# Patient Record
Sex: Female | Born: 1970 | Race: White | Hispanic: No | Marital: Married | Smoking: Never smoker
Health system: Southern US, Community
[De-identification: ages and names within clinical notes are randomized; demographics above are authoritative.]

## PROBLEM LIST (undated history)

## (undated) DIAGNOSIS — R519 Headache, unspecified: Secondary | ICD-10-CM

## (undated) DIAGNOSIS — M5136 Other intervertebral disc degeneration, lumbar region: Secondary | ICD-10-CM

## (undated) DIAGNOSIS — E119 Type 2 diabetes mellitus without complications: Secondary | ICD-10-CM

## (undated) DIAGNOSIS — M1990 Unspecified osteoarthritis, unspecified site: Secondary | ICD-10-CM

## (undated) DIAGNOSIS — M51369 Other intervertebral disc degeneration, lumbar region without mention of lumbar back pain or lower extremity pain: Secondary | ICD-10-CM

## (undated) DIAGNOSIS — E049 Nontoxic goiter, unspecified: Secondary | ICD-10-CM

## (undated) DIAGNOSIS — R51 Headache: Secondary | ICD-10-CM

## (undated) DIAGNOSIS — E039 Hypothyroidism, unspecified: Secondary | ICD-10-CM

## (undated) DIAGNOSIS — M5126 Other intervertebral disc displacement, lumbar region: Secondary | ICD-10-CM

## (undated) HISTORY — PX: OTHER SURGICAL HISTORY: SHX169

## (undated) HISTORY — DX: Hypothyroidism, unspecified: E03.9

## (undated) HISTORY — PX: APPENDECTOMY: SHX54

## (undated) HISTORY — DX: Other intervertebral disc degeneration, lumbar region without mention of lumbar back pain or lower extremity pain: M51.369

## (undated) HISTORY — PX: WISDOM TOOTH EXTRACTION: SHX21

## (undated) HISTORY — DX: Headache: R51

## (undated) HISTORY — PX: BACK SURGERY: SHX140

## (undated) HISTORY — DX: Nontoxic goiter, unspecified: E04.9

## (undated) HISTORY — DX: Unspecified osteoarthritis, unspecified site: M19.90

## (undated) HISTORY — DX: Other intervertebral disc displacement, lumbar region: M51.26

## (undated) HISTORY — DX: Headache, unspecified: R51.9

## (undated) HISTORY — DX: Type 2 diabetes mellitus without complications: E11.9

## (undated) HISTORY — DX: Other intervertebral disc degeneration, lumbar region: M51.36

---

## 2016-08-11 ENCOUNTER — Ambulatory Visit (INDEPENDENT_AMBULATORY_CARE_PROVIDER_SITE_OTHER): Payer: Commercial Managed Care - PPO | Admitting: Neurology

## 2016-08-11 ENCOUNTER — Encounter (INDEPENDENT_AMBULATORY_CARE_PROVIDER_SITE_OTHER): Payer: Self-pay

## 2016-08-11 ENCOUNTER — Encounter: Payer: Self-pay | Admitting: Neurology

## 2016-08-11 VITALS — BP 126/75 | HR 82 | Ht 68.0 in | Wt 219.0 lb

## 2016-08-11 DIAGNOSIS — G43711 Chronic migraine without aura, intractable, with status migrainosus: Secondary | ICD-10-CM | POA: Insufficient documentation

## 2016-08-11 DIAGNOSIS — R519 Headache, unspecified: Secondary | ICD-10-CM

## 2016-08-11 DIAGNOSIS — R51 Headache: Secondary | ICD-10-CM

## 2016-08-11 DIAGNOSIS — G43011 Migraine without aura, intractable, with status migrainosus: Secondary | ICD-10-CM

## 2016-08-11 DIAGNOSIS — R0683 Snoring: Secondary | ICD-10-CM | POA: Diagnosis not present

## 2016-08-11 DIAGNOSIS — M542 Cervicalgia: Secondary | ICD-10-CM

## 2016-08-11 MED ORDER — AMITRIPTYLINE HCL 10 MG PO TABS: 10.0000 mg | ORAL_TABLET | Freq: Every day | ORAL | 11 refills | Status: DC

## 2016-08-11 MED ORDER — METHYLPREDNISOLONE 4 MG PO TBPK: ORAL_TABLET | ORAL | 1 refills | Status: DC

## 2016-08-11 NOTE — Patient Instructions (Addendum)
Remember to drink plenty of fluid, eat healthy meals and do not skip any meals. Try to eat protein with a every meal and eat a healthy snack such as fruit or nuts in between meals. Try to keep a regular sleep-wake schedule and try to exercise daily, particularly in the form of walking, 20-30 minutes a day, if you can.   As far as your medications are concerned, I would like to suggest:  Amitriptyline 10mg  at bedtime  I would like to see you back 8 weeks, sooner if we need to. Please call us with any interim questions, concerns, problems, updates or refill requests.   Please also call us for any test results so we can go over those with you on the phone.  My clinical assistant and will answer any of your questions and relay your messages to me and also relay most of my messages to you.   Our phone number is [phone]. We also have an after hours call service for urgent matters and there is a physician on-call for urgent questions. For any emergencies you know to call 911 or go to the nearest emergency room  Prednisone tablets What is this medicine? PREDNISONE (PRED ni sone) is a corticosteroid. It is commonly used to treat inflammation of the skin, joints, lungs, and other organs. Common conditions treated include asthma, allergies, and arthritis. It is also used for other conditions, such as blood disorders and diseases of the adrenal glands. This medicine may be used for other purposes; ask your health care provider or pharmacist if you have questions. COMMON BRAND NAME(S): Deltasone, Predone, Sterapred, Sterapred DS What should I tell my health care provider before I take this medicine? They need to know if you have any of these conditions: -Cushing's syndrome -diabetes -glaucoma -heart disease -high blood pressure -infection (especially a virus infection such as chickenpox, cold sores, or herpes) -kidney disease -liver disease -mental illness -myasthenia  gravis -osteoporosis -seizures -stomach or intestine problems -thyroid disease -an unusual or allergic reaction to lactose, prednisone, other medicines, foods, dyes, or preservatives -pregnant or trying to get pregnant -breast-feeding How should I use this medicine? Take this medicine by mouth with a glass of water. Follow the directions on the prescription label. Take this medicine with food. If you are taking this medicine once a day, take it in the morning. Do not take more medicine than you are told to take. Do not suddenly stop taking your medicine because you may develop a severe reaction. Your doctor will tell you how much medicine to take. If your doctor wants you to stop the medicine, the dose may be slowly lowered over time to avoid any side effects. Talk to your pediatrician regarding the use of this medicine in children. Special care may be needed. Overdosage: If you think you have taken too much of this medicine contact a poison control center or emergency room at once. NOTE: This medicine is only for you. Do not share this medicine with others. What if I miss a dose? If you miss a dose, take it as soon as you can. If it is almost time for your next dose, talk to your doctor or health care professional. You may need to miss a dose or take an extra dose. Do not take double or extra doses without advice. What may interact with this medicine? Do not take this medicine with any of the following medications: -metyrapone -mifepristone This medicine may also interact with the following medications: -aminoglutethimide -amphotericin B -  aspirin and aspirin-like medicines -barbiturates -certain medicines for diabetes, like glipizide or glyburide -cholestyramine -cholinesterase inhibitors -cyclosporine -digoxin -diuretics -ephedrine -female hormones, like estrogens and birth control pills -isoniazid -ketoconazole -NSAIDS, medicines for pain and inflammation, like ibuprofen or  naproxen -phenytoin -rifampin -toxoids -vaccines -warfarin This list may not describe all possible interactions. Give your health care provider a list of all the medicines, herbs, non-prescription drugs, or dietary supplements you use. Also tell them if you smoke, drink alcohol, or use illegal drugs. Some items may interact with your medicine. What should I watch for while using this medicine? Visit your doctor or health care professional for regular checks on your progress. If you are taking this medicine over a prolonged period, carry an identification card with your name and address, the type and dose of your medicine, and your doctor's name and address. This medicine may increase your risk of getting an infection. Tell your doctor or health care professional if you are around anyone with measles or chickenpox, or if you develop sores or blisters that do not heal properly. If you are going to have surgery, tell your doctor or health care professional that you have taken this medicine within the last twelve months. Ask your doctor or health care professional about your diet. You may need to lower the amount of salt you eat. This medicine may affect blood sugar levels. If you have diabetes, check with your doctor or health care professional before you change your diet or the dose of your diabetic medicine. What side effects may I notice from receiving this medicine? Side effects that you should report to your doctor or health care professional as soon as possible: -allergic reactions like skin rash, itching or hives, swelling of the face, lips, or tongue -changes in emotions or moods -changes in vision -depressed mood -eye pain -fever or chills, cough, sore throat, pain or difficulty passing urine -increased thirst -swelling of ankles, feet Side effects that usually do not require medical attention (report to your doctor or health care professional if they continue or are  bothersome): -confusion, excitement, restlessness -headache -nausea, vomiting -skin problems, acne, thin and shiny skin -trouble sleeping -weight gain This list may not describe all possible side effects. Call your doctor for medical advice about side effects. You may report side effects to FDA at 1-800-FDA-1088. Where should I keep my medicine? Keep out of the reach of children. Store at room temperature between 15 and 30 degrees C (59 and 86 degrees F). Protect from light. Keep container tightly closed. Throw away any unused medicine after the expiration date. NOTE: This sheet is a summary. It may not cover all possible information. If you have questions about this medicine, talk to your doctor, pharmacist, or health care provider.  2018 Elsevier/Gold Standard (2010-09-18 10:57:14)   Amitriptyline tablets What is this medicine? AMITRIPTYLINE (a mee TRIP ti leen) is used to treat depression. This medicine may be used for other purposes; ask your health care provider or pharmacist if you have questions. COMMON BRAND NAME(S): Elavil, Vanatrip What should I tell my health care provider before I take this medicine? They need to know if you have any of these conditions: -an alcohol problem -asthma, difficulty breathing -bipolar disorder or schizophrenia -difficulty passing urine, prostate trouble -glaucoma -heart disease or previous heart attack -liver disease -over active thyroid -seizures -thoughts or plans of suicide, a previous suicide attempt, or family history of suicide attempt -an unusual or allergic reaction to amitriptyline, other medicines, foods, dyes,  or preservatives -pregnant or trying to get pregnant -breast-feeding How should I use this medicine? Take this medicine by mouth with a drink of water. Follow the directions on the prescription label. You can take the tablets with or without food. Take your medicine at regular intervals. Do not take it more often than  directed. Do not stop taking this medicine suddenly except upon the advice of your doctor. Stopping this medicine too quickly may cause serious side effects or your condition may worsen. A special MedGuide will be given to you by the pharmacist with each prescription and refill. Be sure to read this information carefully each time. Talk to your pediatrician regarding the use of this medicine in children. Special care may be needed. Overdosage: If you think you have taken too much of this medicine contact a poison control center or emergency room at once. NOTE: This medicine is only for you. Do not share this medicine with others. What if I miss a dose? If you miss a dose, take it as soon as you can. If it is almost time for your next dose, take only that dose. Do not take double or extra doses. What may interact with this medicine? Do not take this medicine with any of the following medications: -arsenic trioxide -certain medicines used to regulate abnormal heartbeat or to treat other heart conditions -cisapride -droperidol -halofantrine -linezolid -MAOIs like Carbex, Eldepryl, Marplan, Nardil, and Parnate -methylene blue -other medicines for mental depression -phenothiazines like perphenazine, thioridazine and chlorpromazine -pimozide -probucol -procarbazine -sparfloxacin -St. John's Wort -ziprasidone This medicine may also interact with the following medications: -atropine and related drugs like hyoscyamine, scopolamine, tolterodine and others -barbiturate medicines for inducing sleep or treating seizures, like phenobarbital -cimetidine -disulfiram -ethchlorvynol -thyroid hormones such as levothyroxine This list may not describe all possible interactions. Give your health care provider a list of all the medicines, herbs, non-prescription drugs, or dietary supplements you use. Also tell them if you smoke, drink alcohol, or use illegal drugs. Some items may interact with your  medicine. What should I watch for while using this medicine? Tell your doctor if your symptoms do not get better or if they get worse. Visit your doctor or health care professional for regular checks on your progress. Because it may take several weeks to see the full effects of this medicine, it is important to continue your treatment as prescribed by your doctor. Patients and their families should watch out for new or worsening thoughts of suicide or depression. Also watch out for sudden changes in feelings such as feeling anxious, agitated, panicky, irritable, hostile, aggressive, impulsive, severely restless, overly excited and hyperactive, or not being able to sleep. If this happens, especially at the beginning of treatment or after a change in dose, call your health care professional. Bonita Quin may get drowsy or dizzy. Do not drive, use machinery, or do anything that needs mental alertness until you know how this medicine affects you. Do not stand or sit up quickly, especially if you are an older patient. This reduces the risk of dizzy or fainting spells. Alcohol may interfere with the effect of this medicine. Avoid alcoholic drinks. Do not treat yourself for coughs, colds, or allergies without asking your doctor or health care professional for advice. Some ingredients can increase possible side effects. Your mouth may get dry. Chewing sugarless gum or sucking hard candy, and drinking plenty of water will help. Contact your doctor if the problem does not go away or is severe. This medicine  may cause dry eyes and blurred vision. If you wear contact lenses you may feel some discomfort. Lubricating drops may help. See your eye doctor if the problem does not go away or is severe. This medicine can cause constipation. Try to have a bowel movement at least every 2 to 3 days. If you do not have a bowel movement for 3 days, call your doctor or health care professional. This medicine can make you more sensitive to  the sun. Keep out of the sun. If you cannot avoid being in the sun, wear protective clothing and use sunscreen. Do not use sun lamps or tanning beds/booths. What side effects may I notice from receiving this medicine? Side effects that you should report to your doctor or health care professional as soon as possible: -allergic reactions like skin rash, itching or hives, swelling of the face, lips, or tongue -anxious -breathing problems -changes in vision -confusion -elevated mood, decreased need for sleep, racing thoughts, impulsive behavior -eye pain -fast, irregular heartbeat -feeling faint or lightheaded, falls -feeling agitated, angry, or irritable -fever with increased sweating -hallucination, loss of contact with reality -seizures -stiff muscles -suicidal thoughts or other mood changes -tingling, pain, or numbness in the feet or hands -trouble passing urine or change in the amount of urine -trouble sleeping -unusually weak or tired -vomiting -yellowing of the eyes or skin Side effects that usually do not require medical attention (report to your doctor or health care professional if they continue or are bothersome): -change in sex drive or performance -change in appetite or weight -constipation -dizziness -dry mouth -nausea -tired -tremors -upset stomach This list may not describe all possible side effects. Call your doctor for medical advice about side effects. You may report side effects to FDA at 1-800-FDA-1088. Where should I keep my medicine? Keep out of the reach of children. Store at room temperature between 20 and 25 degrees C (68 and 77 degrees F). Throw away any unused medicine after the expiration date. NOTE: This sheet is a summary. It may not cover all possible information. If you have questions about this medicine, talk to your doctor, pharmacist, or health care provider.  2018 Elsevier/Gold Standard (2015-07-05 12:14:15)    Migraine Headache A migraine  headache is an intense, throbbing pain on one side or both sides of the head. Migraines may also cause other symptoms, such as nausea, vomiting, and sensitivity to light and noise. What are the causes? Doing or taking certain things may also trigger migraines, such as:  Alcohol.  Smoking.  Medicines, such as: ? Medicine used to treat chest pain (nitroglycerine). ? Birth control pills. ? Estrogen pills. ? Certain blood pressure medicines.  Aged cheeses, chocolate, or caffeine.  Foods or drinks that contain nitrates, glutamate, aspartame, or tyramine.  Physical activity.  Other things that may trigger a migraine include:  Menstruation.  Pregnancy.  Hunger.  Stress, lack of sleep, too much sleep, or fatigue.  Weather changes.  What increases the risk? The following factors may make you more likely to experience migraine headaches:  Age. Risk increases with age.  Family history of migraine headaches.  Being Caucasian.  Depression and anxiety.  Obesity.  Being a woman.  Having a hole in the heart (patent foramen ovale) or other heart problems.  What are the signs or symptoms? The main symptom of this condition is pulsating or throbbing pain. Pain may:  Happen in any area of the head, such as on one side or both sides.  Interfere  with daily activities.  Get worse with physical activity.  Get worse with exposure to bright lights or loud noises.  Other symptoms may include:  Nausea.  Vomiting.  Dizziness.  General sensitivity to bright lights, loud noises, or smells.  Before you get a migraine, you may get warning signs that a migraine is developing (aura). An aura may include:  Seeing flashing lights or having blind spots.  Seeing bright spots, halos, or zigzag lines.  Having tunnel vision or blurred vision.  Having numbness or a tingling feeling.  Having trouble talking.  Having muscle weakness.  How is this diagnosed? A migraine headache  can be diagnosed based on:  Your symptoms.  A physical exam.  Tests, such as CT scan or MRI of the head. These imaging tests can help rule out other causes of headaches.  Taking fluid from the spine (lumbar puncture) and analyzing it (cerebrospinal fluid analysis, or CSF analysis).  How is this treated? A migraine headache is usually treated with medicines that:  Relieve pain.  Relieve nausea.  Prevent migraines from coming back.  Treatment may also include:  Acupuncture.  Lifestyle changes like avoiding foods that trigger migraines.  Follow these instructions at home: Medicines  Take over-the-counter and prescription medicines only as told by your health care provider.  Do not drive or use heavy machinery while taking prescription pain medicine.  To prevent or treat constipation while you are taking prescription pain medicine, your health care provider may recommend that you: ? Drink enough fluid to keep your urine clear or pale yellow. ? Take over-the-counter or prescription medicines. ? Eat foods that are high in fiber, such as fresh fruits and vegetables, whole grains, and beans. ? Limit foods that are high in fat and processed sugars, such as fried and sweet foods. Lifestyle  Avoid alcohol use.  Do not use any products that contain nicotine or tobacco, such as cigarettes and e-cigarettes. If you need help quitting, ask your health care provider.  Get at least 8 hours of sleep every night.  Limit your stress. General instructions   Keep a journal to find out what may trigger your migraine headaches. For example, write down: ? What you eat and drink. ? How much sleep you get. ? Any change to your diet or medicines.  If you have a migraine: ? Avoid things that make your symptoms worse, such as bright lights. ? It may help to lie down in a dark, quiet room. ? Do not drive or use heavy machinery. ? Ask your health care provider what activities are safe for  you while you are experiencing symptoms.  Keep all follow-up visits as told by your health care provider. This is important. Contact a health care provider if:  You develop symptoms that are different or more severe than your usual migraine symptoms. Get help right away if:  Your migraine becomes severe.  You have a fever.  You have a stiff neck.  You have vision loss.  Your muscles feel weak or like you cannot control them.  You start to lose your balance often.  You develop trouble walking.  You faint. This information is not intended to replace advice given to you by your health care provider. Make sure you discuss any questions you have with your health care provider. Document Released: 02/02/2005 Document Revised: 08/23/2015 Document Reviewed: 07/22/2015 Else[employer] Interactive Patient Education  2017 [employer]

## 2016-08-11 NOTE — Progress Notes (Signed)
[facility]    Provider:  Dr Lucia Gaskins Referring Provider: Burnis Medin, * Primary Care Physician:  Burnis Medin, PA-C  CC:  Headaches  HPI:  Ann Bailey is a 46 y.o. female here as a referral from Dr. Piedad Climes for headache. Past medical history of newly diagnosed diabetes, headache, hyperlipidemia, vitamin D deficiency, headache, rheumatoid arthritis. Previous frequency of headaches 1-2x a month. Last 3 months it has gotten worse. Left temple area, ice pick pain, sharp on the left temple with flashes of light and sometimes a shadow over the right eye in association with the headache. She endorses light sensitivity and needing to cut off the lights which helped. She had the worst headache over memorial day. She went to [facility] on June 6[facility]. She had compazine and Haldol had side effects to the Haldol. She had a ganglion block which helped that day. She was given a prednisone pack which helped. Always centered on the right temple, pressure, head was blowing up. She would have nausea. Some dizziness. Moving head makes it worse. Sitting still helps. She has daily headaches since March never goes away. Right now is dull but then can be severe, worsens throughout the day. She stays tired, she wakes up with headaches, she snores heavily and her family has woken her up and told her she snores. She does not take OTC medication. She is on Gabapentin and hydrocodone. She hears swishing in the right ear. No significant vision changes. She regularly goes to the doctor. Continuous headaches.   Medications tried: Topiramate, gabapentin, prednisone, tramadol, Norco  Reviewed notes, labs and imaging from outside physicians, which showed:  Hemoglobin A1c 8.6 LDL 196 CMP drawn 07/30/2016 shows BUN 9, creatinine 0.7, glucose 227 and AST 39 otherwise normal  Per review of records, patient was seen in the emergency room on June 6 of this year reported new onset headache about 3  months previously located initially in the right temporal area now has radiates spread throughout her entire head. Associated with intermittent neck pain, dizziness, loss of balance. Pain is severe. Tried Topamax, prednisone, tramadol, Norco. No vision changes. No fever, nausea, vomiting, weakness, numbness, vertigo. Neurologic exam was negative CT of the head was negative. She was treated with by mouth and IM headache medications as well as sphenopalatine ganglion block with bupivacaine with improvement of head pain.  MRI of the brain reviewed images (ptient brought disk to review images) normal MRI of the brain 07/16/2016.  Review of Systems: Patient complains of symptoms per HPI as well as the following symptoms: headache, eye pain, bluor twice a month headaches he is see if headache he has not 2 times a month rred vision or   I. Pertinent negatives and positives per HPI. All others negative.   Social History   Social History  . Marital status: Married    Spouse name: N/A  . Number of children: N/A  . Years of education: N/A   Occupational History  . Not on file.   Social History Main Topics  . Smoking status: Never Smoker  . Smokeless tobacco: Never Used  . Alcohol use 0.6 oz/week    1 Shots of liquor per week     Comment: occasionally  . Drug use: No  . Sexual activity: Not on file   Other Topics Concern  . Not on file   Social History Narrative  . No narrative on file    Family History  Problem Relation Age of Onset  . Migraines  Neg Hx     Past Medical History:  Diagnosis Date  . Arthritis   . 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 Diabetes mellitus without complication (HCC)   . Goiter   . Headache   . Hypothyroidism     Past Surgical History:  Procedure Laterality Date  . APPENDECTOMY    . CESAREAN SECTION    . left knee arthoroscpy twice     . WISDOM TOOTH EXTRACTION      Current Outpatient Prescriptions  Medication Sig Dispense  Refill  . ABATACEPT IV Inject 10,000 mg into the vein every 30 (thirty) days.    Marland Kitchen. atorvastatin (LIPITOR) 10 MG tablet Take 10 mg by mouth.    . Cholecalciferol (VITAMIN D3) 2000 units TABS Take by mouth.    . citalopram (CELEXA) 40 MG tablet Take by mouth.    . gabapentin (NEURONTIN) 300 MG capsule TAKE 2 TO 3 CAPSULES NIGHTLY    . HYDROcodone-acetaminophen (NORCO/VICODIN) 5-325 MG tablet Take 1 tablet by mouth every 6 (six) hours as needed.     Marland Kitchen. ibuprofen (ADVIL,MOTRIN) 800 MG tablet prn    . levonorgestrel (MIRENA) 20 MCG/24HR IUD by Intrauterine route.    Marland Kitchen. levothyroxine (SYNTHROID, LEVOTHROID) 200 MCG tablet Take by mouth.    . meclizine (ANTIVERT) 12.5 MG tablet Take by mouth 2 (two) times daily as needed.     . metFORMIN (GLUCOPHAGE-XR) 500 MG 24 hr tablet Take 1,000 mg by mouth.    . sulfaSALAzine (AZULFIDINE) 500 MG EC tablet TAKE THREE TABLETS (1,500 MG TOTAL) BY MOUTH 2 (TWO) TIMES DAILY.    Marland Kitchen. amitriptyline (ELAVIL) 10 MG tablet Take 1 tablet (10 mg total) by mouth at bedtime. 30 tablet 11  . methylPREDNISolone (MEDROL DOSEPAK) 4 MG TBPK tablet follow package directions 21 tablet 1   No current facility-administered medications for this visit.     Allergies as of 08/11/2016  . (No Known Allergies)    Vitals: BP 126/75 (BP Location: Right Arm, Patient Position: Sitting, Cuff Size: Normal)   Pulse 82   Ht 5\' 8"  (1.727 m)   Wt 219 lb (99.3 kg)   BMI 33.30 kg/m  Last Weight:  Wt Readings from Last 1 Encounters:  08/11/16 219 lb (99.3 kg)   Last Height:   Ht Readings from Last 1 Encounters:  08/11/16 5\' 8"  (1.727 m)   Physical exam: Exam: Gen: NAD, conversant, well nourised, obese, well groomed                     CV: RRR, no MRG. No Carotid Bruits. No peripheral edema, warm, nontender Eyes: Conjunctivae clear without exudates or hemorrhage  Neuro: Detailed Neurologic Exam  Speech:    Speech is normal; fluent and spontaneous with normal comprehension.    Cognition:    The patient is oriented to person, place, and time;     recent and remote memory intact;     language fluent;     normal attention, concentration,     fund of knowledge Cranial Nerves:    The pupils are equal, round, and reactive to light. The fundi are normal and spontaneous venous pulsations are present. Visual fields are full to finger confrontation. Extraocular movements are intact. Trigeminal sensation is intact and the muscles of mastication are normal. The face is symmetric. The palate elevates in the midline. Hearing intact. Voice is normal. Shoulder shrug is normal. The tongue has normal motion without fasciculations.   Coordination:  Normal finger to nose and heel to shin. Normal rapid alternating movements.   Gait:    Heel-toe and tandem gait are normal.   Motor Observation:    No asymmetry, no atrophy, and no involuntary movements noted. Tone:    Normal muscle tone.    Posture:    Posture is normal. normal erect    Strength:    Strength is V/V in the upper and lower limbs.      Sensation: intact to LT     Reflex Exam:  DTR's:    Deep tendon reflexes in the upper and lower extremities are normal bilaterally.   Toes:    The toes are downgoing bilaterally.   Clonus:    Clonus is absent.     Assessment/Plan:  Patient with worsened migraines over the last 4 months. She has tried multiple medications including topiramate. MRI brain negative.Neurologic exam normal.  - Topiramate was used only at low dose before she stopped it, I still think we can come back and use a therapeutic dose; don;t want to discount Topiramate until on a therapeutic dose - At this tim ewith try a small dose of Amitriptyline at night which may also help with patient's insomnia. Then can retry topiramate at a higher dose - Sleep evaluation:  She stays tired, she wakes up with headaches, she snores heavily and her family has woken her up and told her she snores.  - Suggest not  overusing opioids or OTC meds (she takes for RA only occassonally) but it appears there is no medication overuse.  - Nerve blocks today  - Prednisone taper but patient must be very careful with her diabetes and monitor her glucose daily - MRI without signs of IIH but can consider checking opening pressure in the future, no visual symptoms per patient - Discussed good eating habits and food choices, exercise and diet. Follow closely with pcp to manage vascular risk factors such as DM, HTN, HLD   Discussed: To prevent or relieve headaches, try the following: Cool Compress. Lie down and place a cool compress on your head.  Avoid headache triggers. If certain foods or odors seem to have triggered your migraines in the past, avoid them. A headache diary might help you identify triggers.  Include physical activity in your daily routine. Try a daily walk or other moderate aerobic exercise.  Manage stress. Find healthy ways to cope with the stressors, such as delegating tasks on your to-do list.  Practice relaxation techniques. Try deep breathing, yoga, massage and visualization.  Eat regularly. Eating regularly scheduled meals and maintaining a healthy diet might help prevent headaches. Also, drink plenty of fluids.  Follow a regular sleep schedule. Sleep deprivation might contribute to headaches Consider biofeedback. With this mind-body technique, you learn to control certain bodily functions - such as muscle tension, heart rate and blood pressure - to prevent headaches or reduce headache pain.    Proceed to emergency room if you experience new or worsening symptoms or symptoms do not resolve, if you have new neurologic symptoms or if headache is severe, or for any concerning symptom.   Provided education and documentation from American headache Society toolbox including articles on: chronic migraine medication overuse headache, chronic migraines, prevention of migraines, behavioral and other  nonpharmacologic treatments for headache.  Orders Placed This Encounter  Procedures  . Ambulatory referral to Sleep Studies    Cc:  [place], [place], [place]   Naomie Dean, MD  [facility] [address]  [place], [place] [address]  Phone [phone] Fax [phone]

## 2016-08-11 NOTE — Progress Notes (Signed)
Performed by Dr. Lucia GaskinsAhern M.D.  All procedures a documented below were medically necessary, reasonable and appropriate based on the patient's history, medical diagnosis and physician opinion. Verbal informed consent was obtained from the patient, patient was informed of potential risk of procedure, including bruising, bleeding, hematoma formation, infection, muscle weakness, muscle pain, numbness, transient hypertension, transient hyperglycemia and transient insomnia among others. All areas injected were topically clean with isopropyl rubbing alcohol. Nonsterile nonlatex gloves were worn during the procedure.  [id number]: occipitalis bilaterally, frontalis, temporalis bilaterally   SPHENOCATH PROCEDURE NOTE   Procedure: The patient was placed in the supine position. A temperature strip was added to the cheek area after the area was cleaned with alcohol. The Sphenocath was lubricated with gel, and placed in the right naris. The catheter was inserted above the middle turbinate to the posterior nasal cavity, and then withdrawn 1 cm. The catheter was deployed and rotated approximately 20 towards the nose. 2-1/2 mL of 2% lidocaine was deployed. The patient was asked to swallow during the injection. The patient demonstrated erythema of the sclera of the eye on this side, and an increase in the cheek temperature was noted from 92 F to 98 F.  This process was repeated on the left side, with similar results. The increase in cheek temperature was documented from 8792 F to 100 F.  The patient tolerated the procedure well. No complications of the procedure were noted. The patient was kept in the supine position for 8 minutes following the procedure. She was given small sips of water after sitting up following the procedure.  Lidocaine 2% NDC 82956-213-0863323-466-27 Lot: [id number]  Exp: 12/2017  NDC: 62952-841-3263323-486-27   Naomie DeanAhern, Antonia B  Temperature on cheeks before injecting lidocaine:  Right: 92 Left: 92  Temperature on  cheeks after injecting lidocaine:  Right : 98 Left: 100  Lidocaine 2% Lot: [id number] Exp: 12/2017 NDC: 25366-440-3463323-486-27

## 2016-08-11 NOTE — Progress Notes (Signed)
Nerve block:  Xylocaine 2% (20 mg/ml) NDC 16109-604-5403323-486-27 Lot [id number] Exp 11/20  Bupivacaine 0.25%  NDC [id number] Lot [id number] Exp: 03/22  Six 3 ml syringes w/ 1.5 ml marcaine and 1.5 ml lidocaine

## 2016-08-12 ENCOUNTER — Telehealth: Payer: Self-pay | Admitting: Neurology

## 2016-08-12 NOTE — Telephone Encounter (Signed)
Ann Bailey called to inform Dr Lucia GaskinsAhern that re: her head aches it is only slightly there in the right temple, does not feel she needs to return to office right away. Ann Bailey not requesting a call back.

## 2016-09-22 ENCOUNTER — Ambulatory Visit (INDEPENDENT_AMBULATORY_CARE_PROVIDER_SITE_OTHER): Payer: Commercial Managed Care - PPO | Admitting: Neurology

## 2016-09-22 ENCOUNTER — Encounter: Payer: Self-pay | Admitting: Neurology

## 2016-09-22 VITALS — BP 121/75 | HR 82 | Ht 68.5 in | Wt 217.0 lb

## 2016-09-22 DIAGNOSIS — R0683 Snoring: Secondary | ICD-10-CM

## 2016-09-22 DIAGNOSIS — M05772 Rheumatoid arthritis with rheumatoid factor of left ankle and foot without organ or systems involvement: Secondary | ICD-10-CM

## 2016-09-22 DIAGNOSIS — R51 Headache: Secondary | ICD-10-CM

## 2016-09-22 DIAGNOSIS — E669 Obesity, unspecified: Secondary | ICD-10-CM

## 2016-09-22 DIAGNOSIS — G4719 Other hypersomnia: Secondary | ICD-10-CM | POA: Diagnosis not present

## 2016-09-22 DIAGNOSIS — M05771 Rheumatoid arthritis with rheumatoid factor of right ankle and foot without organ or systems involvement: Secondary | ICD-10-CM

## 2016-09-22 DIAGNOSIS — R519 Headache, unspecified: Secondary | ICD-10-CM

## 2016-09-22 NOTE — Progress Notes (Signed)
Subjective:    Patient ID: Ann Bailey is a 46 y.o. female.  HPI     Huston Foley, MD, PhD [facility] [address], [address] [address] [place], [place] [address]  Dear Desma Maxim,   I saw your patient, Ann Bailey, upon your kind request in my clinic today for initial consultation of her sleep disorder, in particular, concern for underlying obstructive sleep apnea. The patient is unaccompanied today. As you know, Ann Bailey is a 46 year old right-handed woman with an underlying medical history of diabetes, recurrent headaches, hyperlipidemia, vitamin D deficiency, rheumatoid arthritis, hypothyroidism, and obesity, who reports snoring and excessive daytime somnolence, as well as morning headaches. I reviewed your office note from 08/11/2016. Her Epworth sleepiness score is 7 out of 24, fatigue score is 37 out of 63. She reports that she snores loudly at times per family's feedback. She lives with her husband and her 2 children. She works with home health. She is a nonsmoker, does not drink alcohol and drinks caffeine in limitation, usually 1 soda per day. She is familiar with sleep apnea diagnosis and treatment as her husband has OSA and CPAP machine. She is not sure that she would be able to tolerate a CPAP machine. She has occasional morning headaches, denies night to night nocturia or restless leg symptoms or leg twitching at night. She does not always wake up rested and does feel tired during the day, attributed daytime tiredness to having rheumatoid arthritis. She has a family history of RA in her father. She was diagnosed in 2000. She sees a rheumatologist. She reports stress at work. She works generally speaking from 8-5. Bedtime varies, between 9 PM and midnight, wakeup time around 6:30 AM. She was started on amitriptyline recently, feels like she has been sleeping a little better after starting it.   Her Past Medical History Is Significant For: Past Medical  History:  Diagnosis Date  . Arthritis   . 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 Diabetes mellitus without complication (HCC)   . Goiter   . Headache   . Hypothyroidism     Her Past Surgical History Is Significant For: Past Surgical History:  Procedure Laterality Date  . APPENDECTOMY    . CESAREAN SECTION    . left knee arthoroscpy twice     . WISDOM TOOTH EXTRACTION      Her Family History Is Significant For: Family History  Problem Relation Age of Onset  . Migraines Neg Hx     Her Social History Is Significant For: Social History   Social History  . Marital status: Married    Spouse name: N/A  . Number of children: N/A  . Years of education: N/A   Social History Main Topics  . Smoking status: Never Smoker  . Smokeless tobacco: Never Used  . Alcohol use 0.6 oz/week    1 Shots of liquor per week     Comment: occasionally  . Drug use: No  . Sexual activity: Not Asked   Other Topics Concern  . None   Social History Narrative  . None    Her Allergies Are:  No Known Allergies:   Her Current Medications Are:  Outpatient Encounter Prescriptions as of 09/22/2016  Medication Sig  . ABATACEPT IV Inject 1,000 mg into the vein every 30 (thirty) days.   Marland Kitchen amitriptyline (ELAVIL) 10 MG tablet Take 1 tablet (10 mg total) by mouth at bedtime.  Marland Kitchen atorvastatin (LIPITOR) 10 MG tablet  Take 10 mg by mouth.  . Cholecalciferol (VITAMIN D3) 2000 units TABS Take by mouth.  . citalopram (CELEXA) 40 MG tablet Take by mouth.  . gabapentin (NEURONTIN) 300 MG capsule TAKE 2 TO 3 CAPSULES NIGHTLY  . HYDROcodone-acetaminophen (NORCO/VICODIN) 5-325 MG tablet Take 1 tablet by mouth every 6 (six) hours as needed.   Marland Kitchen ibuprofen (ADVIL,MOTRIN) 800 MG tablet prn  . levonorgestrel (MIRENA) 20 MCG/24HR IUD by Intrauterine route.  Marland Kitchen levothyroxine (SYNTHROID, LEVOTHROID) 200 MCG tablet Take by mouth.  . meclizine (ANTIVERT) 12.5 MG tablet Take by mouth 2 (two) times daily as  needed.   . metFORMIN (GLUCOPHAGE-XR) 500 MG 24 hr tablet Take 1,000 mg by mouth.  . sulfaSALAzine (AZULFIDINE) 500 MG EC tablet TAKE THREE TABLETS (1,500 MG TOTAL) BY MOUTH 2 (TWO) TIMES DAILY.  . [DISCONTINUED] methylPREDNISolone (MEDROL DOSEPAK) 4 MG TBPK tablet follow package directions   No facility-administered encounter medications on file as of 09/22/2016.   :  Review of Systems:  Out of a complete 14 point review of systems, all are reviewed and negative with the exception of these symptoms as listed below: Review of Systems  Neurological:       Pt presents today to discuss her sleep. Pt has never had a sleep study but does endorse snoring. Pt's husband has osa and uses a cpap nightly.  Epworth Sleepiness Scale 0= would never doze 1= slight chance of dozing 2= moderate chance of dozing 3= high chance of dozing  Sitting and reading: 1 Watching TV: 1 Sitting inactive in a public place (ex. Theater or meeting): 0 As a passenger in a car for an hour without a break: 2 Lying down to rest in the afternoon: 2 Sitting and talking to someone: 0 Sitting quietly after lunch (no alcohol): 1 In a car, while stopped in traffic: 0 Total: 7     Objective:  Neurological Exam  Physical Exam Physical Examination:   Vitals:   09/22/16 1051  BP: 121/75  Pulse: 82    General Examination: The patient is a very pleasant 46 y.o. female in no acute distress. She appears well-developed and well-nourished and well groomed.   HEENT: Normocephalic, atraumatic, pupils are equal, round and reactive to light and accommodation. Funduscopic exam is normal with sharp disc margins noted. Extraocular tracking is good without limitation to gaze excursion or nystagmus noted. Normal smooth pursuit is noted. Hearing is grossly intact. Tympanic membranes are clear bilaterally. Face is symmetric with normal facial animation and normal facial sensation. Speech is clear with no dysarthria noted. There is no  hypophonia. There is no lip, neck/head, jaw or voice tremor. Neck is supple with full range of passive and active motion. There are no carotid bruits on auscultation. Oropharynx exam reveals: mild mouth dryness, good dental hygiene and moderate airway crowding, due to smaller airway entry, larger appearing uvula and tonsils in place, about 1-2+ in size bilaterally. She has a mild to moderate overbite. Neck circumference is 16-1/4 inches. Mallampati is class II. Tongue protrudes centrally and palate elevates symmetrically.   Chest: Clear to auscultation without wheezing, rhonchi or crackles noted.  Heart: S1+S2+0, regular and normal without murmurs, rubs or gallops noted.   Abdomen: Soft, non-tender and non-distended with normal bowel sounds appreciated on auscultation.  Extremities: There is no pitting edema in the distal lower extremities bilaterally. Pedal pulses are intact.  Skin: Warm and dry without trophic changes noted.  Musculoskeletal: exam reveals no obvious joint deformities, tenderness or joint swelling or  erythema.   Neurologically:  Mental status: The patient is awake, alert and oriented in all 4 spheres. Her immediate and remote memory, attention, language skills and fund of knowledge are appropriate. There is no evidence of aphasia, agnosia, apraxia or anomia. Speech is clear with normal prosody and enunciation. Thought process is linear. Mood is normal and affect is normal.  Cranial nerves II - XII are as described above under HEENT exam. In addition: shoulder shrug is normal with equal shoulder height noted. Motor exam: Normal bulk, strength and tone is noted. There is no drift, tremor or rebound. Romberg is negative. Reflexes are 1+ throughout. Fine motor skills and coordination: intact with normal finger taps, normal hand movements, normal rapid alternating patting, normal foot taps and normal foot agility.  Cerebellar testing: No dysmetria or intention tremor on finger to nose  testing. Heel to shin is unremarkable bilaterally. There is no truncal or gait ataxia.  Sensory exam: intact to light touch in the upper and lower extremities.  Gait, station and balance: She stands easily. No veering to one side is noted. No leaning to one side is noted. Posture is age-appropriate and stance is narrow based. Gait shows normal stride length and normal pace. No problems turning are noted. Tandem walk is unremarkable.   Assessment and Plan:  In summary, NANCYJO GIVHAN is a very pleasant 46 y.o.-year old female with an underlying medical history of diabetes, recurrent headaches, hyperlipidemia, vitamin D deficiency, rheumatoid arthritis, hypothyroidism, and obesity, whose history and physical exam are concerning for obstructive sleep apnea (OSA). I had a long chat with the patient about my findings and the diagnosis of OSA, its prognosis and treatment options. We talked about medical treatments, surgical interventions and non-pharmacological approaches. I explained in particular the risks and ramifications of untreated moderate to severe OSA, especially with respect to developing cardiovascular disease down the Road, including congestive heart failure, difficult to treat hypertension, cardiac arrhythmias, or stroke. Even type 2 diabetes has, in part, been linked to untreated OSA. Symptoms of untreated OSA include daytime sleepiness, memory problems, mood irritability and mood disorder such as depression and anxiety, lack of energy, as well as recurrent headaches, especially morning headaches. We talked about trying to maintain a healthy lifestyle in general, as well as the importance of weight control. I encouraged the patient to eat healthy, exercise daily and keep well hydrated, to keep a scheduled bedtime and wake time routine, to not skip any meals and eat healthy snacks in between meals. I advised the patient not to drive when feeling sleepy. I recommended the following at this time:  sleep study with potential positive airway pressure titration. (We will score hypopneas at 4%).   I explained the sleep test procedure to the patient and also outlined possible surgical and non-surgical treatment options of OSA, including the use of a custom-made dental device (which would require a referral to a specialist dentist or oral surgeon), upper airway surgical options, such as pillar implants, radiofrequency surgery, tongue base surgery, and UPPP (which would involve a referral to an ENT surgeon). Rarely, jaw surgery such as mandibular advancement may be considered.  I also explained the CPAP treatment option to the patient, who indicated that she would be willing to try CPAP if the need arises. I explained the importance of being compliant with PAP treatment, not only for insurance purposes but primarily to improve Her symptoms, and for the patient's long term health benefit, including to reduce Her cardiovascular risks. I answered all  her questions today and the patient was in agreement. I would like to see her back after the sleep study is completed and encouraged her to call with any interim questions, concerns, problems or updates.   Thank you very much for allowing me to participate in the care of this nice patient. If I can be of any further assistance to you please do not hesitate to talk to me.  Sincerely,   Huston FoleySaima Jakhai Fant, MD, PhD

## 2016-09-22 NOTE — Patient Instructions (Signed)

## 2016-10-12 ENCOUNTER — Encounter: Payer: Self-pay | Admitting: Neurology

## 2016-10-12 ENCOUNTER — Ambulatory Visit (INDEPENDENT_AMBULATORY_CARE_PROVIDER_SITE_OTHER): Payer: Commercial Managed Care - PPO | Admitting: Neurology

## 2016-10-12 VITALS — BP 140/90 | HR 99 | Ht 68.5 in | Wt 219.2 lb

## 2016-10-12 DIAGNOSIS — G43711 Chronic migraine without aura, intractable, with status migrainosus: Secondary | ICD-10-CM | POA: Diagnosis not present

## 2016-10-12 DIAGNOSIS — M542 Cervicalgia: Secondary | ICD-10-CM | POA: Diagnosis not present

## 2016-10-12 MED ORDER — PROPRANOLOL HCL 20 MG PO TABS: 20.0000 mg | ORAL_TABLET | Freq: Two times a day (BID) | ORAL | 6 refills | Status: DC

## 2016-10-12 MED ORDER — AMITRIPTYLINE HCL 10 MG PO TABS: 20.0000 mg | ORAL_TABLET | Freq: Every day | ORAL | 11 refills | Status: DC

## 2016-10-12 NOTE — Progress Notes (Signed)
[facility]    Provider:  Dr Lucia Gaskins Referring Provider: Burnis Medin, * Primary Care Physician:  Piedad Climes, [place], [place]  CC:  Headaches   Interval history 10/12/2016: Topiramate caused side effects, she is on the Amitriptyline. She feels improved bu still having 15 headache days a month and 10 migraine days. She has seen Dr. Frances Furbish and sleep eval, pending insurance approval, discussed other migraine preventative medications and we can try and decided to start propranolol. There is no aura, no medication overuse.  HPI:  Ann Bailey is a 46 y.o. female here as a referral from Dr. Piedad Climes for headache. Past medical history of newly diagnosed diabetes, headache, hyperlipidemia, vitamin D deficiency, headache, rheumatoid arthritis. Previous frequency of headaches 1-2x a month. Last 5 months it has gotten worse. Left temple area, ice pick pain, sharp on the left temple, pounding, throbbing  and sometimes a shadow over the right eye in association with the headache and nausea. Can last up to 24 hours.  She endorses light sensitivity and needing to cut off the lights which helped. She had the worst headache over memorial day. She went [facility]. She had compazine and Haldol had side effects to the Haldol. She had a ganglion block which helped that day. She was given a prednisone pack which helped. Always centered on the right temple, pressure, head was blowing up. She would have nausea. Some dizziness. Moving head makes it worse. Sitting still helps. She has daily headaches since March never goes away. Right now is dull but then can be severe, worsens throughout the day. She stays tired, she wakes up with headaches, she snores heavily and her family has woken her up and told her she snores. She does not take OTC medication. She is on Gabapentin and hydrocodone. She hears swishing in the right ear. No significant vision changes. She regularly goes to the  doctor. Continuous headaches.   Medications tried: Topiramate, gabapentin, prednisone, tramadol, Norco  Reviewed notes, labs and imaging from outside physicians, which showed:  Hemoglobin A1c 8.6 LDL 196 CMP drawn 07/30/2016 shows BUN 9, creatinine 0.7, glucose 227 and AST 39 otherwise normal  Per review of records, patient was seen in the emergency room on June 6 of this year reported new onset headache about 3 months previously located initially in the right temporal area now has radiates spread throughout her entire head. Associated with intermittent neck pain, dizziness, loss of balance. Pain is severe. Tried Topamax, prednisone, tramadol, Norco. No vision changes. No fever, nausea, vomiting, weakness, numbness, vertigo. Neurologic exam was negative CT of the head was negative. She was treated with by mouth and IM headache medications as well as sphenopalatine ganglion block with bupivacaine with improvement of head pain.  MRI of the brain reviewed images (ptient brought disk to review images) normal MRI of the brain 07/16/2016.  Review of Systems: Patient complains of symptoms per HPI as well as the following symptoms: headache, eye pain, bluor twice a month headaches he is see if headache he has not 2 times a month rred vision or   I. Pertinent negatives and positives per HPI. All others negative.   Social History   Social History  . Marital status: Married    Spouse name: N/A  . Number of children: N/A  . Years of education: N/A   Occupational History  . Not on file.   Social History Main Topics  . Smoking status: Never Smoker  . Smokeless tobacco: Never  Used  . Alcohol use 0.6 oz/week    1 Shots of liquor per week     Comment: occasionally  . Drug use: No  . Sexual activity: Not on file   Other Topics Concern  . Not on file   Social History Narrative  . No narrative on file    Family History  Problem Relation Age of Onset  . Migraines Neg Hx      Past Medical History:  Diagnosis Date  . Arthritis   . 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 Diabetes mellitus without complication (HCC)   . Goiter   . Headache   . Hypothyroidism     Past Surgical History:  Procedure Laterality Date  . APPENDECTOMY    . CESAREAN SECTION    . left knee arthoroscpy twice     . WISDOM TOOTH EXTRACTION      Current Outpatient Prescriptions  Medication Sig Dispense Refill  . ABATACEPT IV Inject 1,000 mg into the vein every 30 (thirty) days.     Marland Kitchen amitriptyline (ELAVIL) 10 MG tablet Take 1 tablet (10 mg total) by mouth at bedtime. 30 tablet 11  . atorvastatin (LIPITOR) 10 MG tablet Take 10 mg by mouth.    . Cholecalciferol (VITAMIN D3) 2000 units TABS Take by mouth.    . citalopram (CELEXA) 40 MG tablet Take by mouth.    . gabapentin (NEURONTIN) 300 MG capsule TAKE 2 TO 3 CAPSULES NIGHTLY    . HYDROcodone-acetaminophen (NORCO/VICODIN) 5-325 MG tablet Take 1 tablet by mouth every 6 (six) hours as needed.     Marland Kitchen ibuprofen (ADVIL,MOTRIN) 800 MG tablet prn    . levonorgestrel (MIRENA) 20 MCG/24HR IUD by Intrauterine route.    Marland Kitchen levothyroxine (SYNTHROID, LEVOTHROID) 200 MCG tablet Take by mouth.    . meclizine (ANTIVERT) 12.5 MG tablet Take by mouth 2 (two) times daily as needed.     . metFORMIN (GLUCOPHAGE-XR) 500 MG 24 hr tablet Take 1,000 mg by mouth.    . sulfaSALAzine (AZULFIDINE) 500 MG EC tablet TAKE THREE TABLETS (1,500 MG TOTAL) BY MOUTH 2 (TWO) TIMES DAILY.     No current facility-administered medications for this visit.     Allergies as of 10/12/2016  . (No Known Allergies)    Vitals: BP 140/90 (BP Location: Right Arm, Patient Position: Sitting, Cuff Size: Normal)   Pulse 99   Ht 5' 8.5" (1.74 m)   Wt 219 lb 3.2 oz (99.4 kg)   SpO2 96%   BMI 32.84 kg/m  Last Weight:  Wt Readings from Last 1 Encounters:  10/12/16 219 lb 3.2 oz (99.4 kg)   Last Height:   Ht Readings from Last 1 Encounters:  10/12/16 5' 8.5"  (1.74 m)   Physical exam: Exam: Gen: NAD, conversant, well nourised, obese, well groomed                     CV: RRR, no MRG. No Carotid Bruits. No peripheral edema, warm, nontender Eyes: Conjunctivae clear without exudates or hemorrhage  Neuro: Detailed Neurologic Exam  Speech:    Speech is normal; fluent and spontaneous with normal comprehension.  Cognition:    The patient is oriented to person, place, and time;     recent and remote memory intact;     language fluent;     normal attention, concentration,     fund of knowledge Cranial Nerves:    The pupils are equal, round, and reactive to  light. The fundi are normal and spontaneous venous pulsations are present. Visual fields are full to finger confrontation. Extraocular movements are intact. Trigeminal sensation is intact and the muscles of mastication are normal. The face is symmetric. The palate elevates in the midline. Hearing intact. Voice is normal. Shoulder shrug is normal. The tongue has normal motion without fasciculations.   Coordination:    Normal finger to nose and heel to shin. Normal rapid alternating movements.   Gait:    Heel-toe and tandem gait are normal.   Motor Observation:    No asymmetry, no atrophy, and no involuntary movements noted. Tone:    Normal muscle tone.    Posture:    Posture is normal. normal erect    Strength:    Strength is V/V in the upper and lower limbs.      Sensation: intact to LT     Reflex Exam:  DTR's:    Deep tendon reflexes in the upper and lower extremities are normal bilaterally.   Toes:    The toes are downgoing bilaterally.   Clonus:    Clonus is absent.   Assessment/Plan:  Patient with worsened migraines over the last 6 months. She has tried multiple medications including topiramate. MRI brain negative.Neurologic exam normal. Chronic migraines without aura.  - Physical therapy for dry needling - Increase Amitriptyline at night which may also help with  patient's insomnia.  - Sleep evaluation:  She stays tired, she wakes up with headaches, she snores heavily and her family has woken her up and told her she snores. Approval pending. - Suggest not overusing opioids or OTC meds (she takes for RA only occassonally) but it appears there is no medication overuse.  - Discussed good eating habits and food choices, exercise and diet. Follow closely with pcp to manage vascular risk factors such as DM, HTN, HLD - She is a botox candidate  PT and dry needling Botox for migraines: discussed, will go forward  Ann Dean, MD  [facility] [address] [place], [place] [address]  Phone [phone] Fax [phone]  A total of 25 minutes was spent face-to-face with this patient. Over half this time was spent on counseling patient on the chronic migraine diagnosis and different diagnostic and therapeutic options available.

## 2016-10-12 NOTE — Patient Instructions (Signed)
Propranolol tablets What is this medicine? PROPRANOLOL (proe PRAN oh lole) is a beta-blocker. Beta-blockers reduce the workload on the heart and help it to beat more regularly. This medicine is used to treat high blood pressure, to control irregular heart rhythms (arrhythmias) and to relieve chest pain caused by angina. It may also be helpful after a heart attack. This medicine is also used to prevent migraine headaches, relieve uncontrollable shaking (tremors), and help certain problems related to the thyroid gland and adrenal gland. This medicine may be used for other purposes; ask your health care provider or pharmacist if you have questions. COMMON BRAND NAME(S): Inderal What should I tell my health care provider before I take this medicine? They need to know if you have any of these conditions: -circulation problems or blood vessel disease -diabetes -history of heart attack or heart disease, vasospastic angina -kidney disease -liver disease -lung or breathing disease, like asthma or emphysema -pheochromocytoma -slow heart rate -thyroid disease -an unusual or allergic reaction to propranolol, other beta-blockers, medicines, foods, dyes, or preservatives -pregnant or trying to get pregnant -breast-feeding How should I use this medicine? Take this medicine by mouth with a glass of water. Follow the directions on the prescription label. Take your doses at regular intervals. Do not take your medicine more often than directed. Do not stop taking except on your the advice of your doctor or health care professional. Talk to your pediatrician regarding the use of this medicine in children. Special care may be needed. Overdosage: If you think you have taken too much of this medicine contact a poison control center or emergency room at once. NOTE: This medicine is only for you. Do not share this medicine with others. What if I miss a dose? If you miss a dose, take it as soon as you can. If it is  almost time for your next dose, take only that dose. Do not take double or extra doses. What may interact with this medicine? Do not take this medicine with any of the following medications: -feverfew -phenothiazines like chlorpromazine, mesoridazine, prochlorperazine, thioridazine This medicine may also interact with the following medications: -aluminum hydroxide gel -antipyrine -antiviral medicines for HIV or AIDS -barbiturates like phenobarbital -certain medicines for blood pressure, heart disease, irregular heart beat -cimetidine -ciprofloxacin -diazepam -fluconazole -haloperidol -isoniazid -medicines for cholesterol like cholestyramine or colestipol -medicines for mental depression -medicines for migraine headache like almotriptan, eletriptan, frovatriptan, naratriptan, rizatriptan, sumatriptan, zolmitriptan -NSAIDs, medicines for pain and inflammation, like ibuprofen or naproxen -phenytoin -rifampin -teniposide -theophylline -thyroid medicines -tolbutamide -warfarin -zileuton This list may not describe all possible interactions. Give your health care provider a list of all the medicines, herbs, non-prescription drugs, or dietary supplements you use. Also tell them if you smoke, drink alcohol, or use illegal drugs. Some items may interact with your medicine. What should I watch for while using this medicine? Visit your doctor or health care professional for regular check ups. Check your blood pressure and pulse rate regularly. Ask your health care professional what your blood pressure and pulse rate should be, and when you should contact them. You may get drowsy or dizzy. Do not drive, use machinery, or do anything that needs mental alertness until you know how this drug affects you. Do not stand or sit up quickly, especially if you are an older patient. This reduces the risk of dizzy or fainting spells. Alcohol can make you more drowsy and dizzy. Avoid alcoholic drinks. This  medicine can affect blood sugar levels. If   you have diabetes, check with your doctor or health care professional before you change your diet or the dose of your diabetic medicine. Do not treat yourself for coughs, colds, or pain while you are taking this medicine without asking your doctor or health care professional for advice. Some ingredients may increase your blood pressure. What side effects may I notice from receiving this medicine? Side effects that you should report to your doctor or health care professional as soon as possible: -allergic reactions like skin rash, itching or hives, swelling of the face, lips, or tongue -breathing problems -changes in blood sugar -cold hands or feet -difficulty sleeping, nightmares -dry peeling skin -hallucinations -muscle cramps or weakness -slow heart rate -swelling of the legs and ankles -vomiting Side effects that usually do not require medical attention (report to your doctor or health care professional if they continue or are bothersome): -change in sex drive or performance -diarrhea -dry sore eyes -hair loss -nausea -weak or tired This list may not describe all possible side effects. Call your doctor for medical advice about side effects. You may report side effects to FDA at 1-800-FDA-1088. Where should I keep my medicine? Keep out of the reach of children. Store at room temperature between 15 and 30 degrees C (59 and 86 degrees F). Protect from light. Throw away any unused medicine after the expiration date. NOTE: This sheet is a summary. It may not cover all possible information. If you have questions about this medicine, talk to your doctor, pharmacist, or health care provider.  2018 Elsevier/Gold Standard (2012-10-07 14:51:53)  

## 2016-10-25 ENCOUNTER — Ambulatory Visit (INDEPENDENT_AMBULATORY_CARE_PROVIDER_SITE_OTHER): Payer: Commercial Managed Care - PPO | Admitting: Neurology

## 2016-10-25 DIAGNOSIS — G4733 Obstructive sleep apnea (adult) (pediatric): Secondary | ICD-10-CM | POA: Diagnosis not present

## 2016-10-25 DIAGNOSIS — R51 Headache: Secondary | ICD-10-CM

## 2016-10-25 DIAGNOSIS — R519 Headache, unspecified: Secondary | ICD-10-CM

## 2016-10-25 DIAGNOSIS — M0579 Rheumatoid arthritis with rheumatoid factor of multiple sites without organ or systems involvement: Secondary | ICD-10-CM

## 2016-10-25 DIAGNOSIS — E669 Obesity, unspecified: Secondary | ICD-10-CM

## 2016-10-25 DIAGNOSIS — G4720 Circadian rhythm sleep disorder, unspecified type: Secondary | ICD-10-CM

## 2016-10-26 NOTE — Procedures (Signed)
PATIENT'S NAME:  Ann Bailey, Ann Bailey DOB:      1970/03/18      MR#:    [id number]     DATE OF RECORDING: 10/25/2016 REFERRING M.D.: Naomie Dean, MD   PCP: Burnett Kanaris, PA-C Study Performed:   Baseline Polysomnogram HISTORY: 46 year old woman with a history of diabetes, recurrent headaches, hyperlipidemia, vitamin D deficiency, rheumatoid arthritis, hypothyroidism, and obesity, who reports snoring and excessive daytime somnolence, as well as morning headaches. The patient endorsed the Epworth Sleepiness Scale at 7/24 points. The patient's weight 217 pounds with a height of 68 (inches), resulting in a BMI of 32.4 kg/m2. The patient's neck circumference measured 16.2 inches.  CURRENT MEDICATIONS: Abatacept, Amitriptyline, Atorvastatin, Cholecalciferol, Citalopram, Gabapentin, Hydrocodone, Ibuprofen, Levothyroxine, Meclizine, Metformin and Sulfasalazine   PROCEDURE:  This is a multichannel digital polysomnogram utilizing the Somnostar 11.2 system.  Electrodes and sensors were applied and monitored per AASM Specifications.   EEG, EOG, Chin and Limb EMG, were sampled at 200 Hz.  ECG, Snore and Nasal Pressure, Thermal Airflow, Respiratory Effort, CPAP Flow and Pressure, Oximetry was sampled at 50 Hz. Digital video and audio were recorded.      BASELINE STUDY  Lights Out was at 21:16 and Lights On at 05:01.  Total recording time (TRT) was 465.5 minutes, with a total sleep time (TST) of  350.5 minutes.   The patient's sleep latency was 76 minutes, which is delayed. REM sleep was absent. The sleep efficiency was 75.3 %, which is reduced.    SLEEP ARCHITECTURE: WASO (Wake after sleep onset) was 49.5 minutes with mild to moderate sleep fragmentation noted.  There were 29 minutes in Stage N1, 321.5 minutes Stage N2, 0 minutes Stage N3 and 0 minutes in Stage REM.  The percentage of Stage N1 was 8.3%, Stage N2 was 91.7%, which is markedly increased, both Stage N3 and Stage R (REM sleep) were absent. The  arousals were noted as: 71 were spontaneous, 0 were associated with PLMs, 48 were associated with respiratory events.    Audio and video analysis did not show any abnormal or unusual movements, behaviors, phonations or vocalizations.  The patient took no bathroom breaks. Mild snoring was noted.  EKG was in keeping with normal sinus rhythm (NSR).  RESPIRATORY ANALYSIS:  There were a total of 48 respiratory events:  0 obstructive apneas, 0 central apneas and 0 mixed apneas with a total of 0 apneas and an apnea index (AI) of 0 /hour. There were 48 hypopneas with a hypopnea index of 8.2 /hour. The patient also had 0 respiratory event related arousals (RERAs).      The total APNEA/HYPOPNEA INDEX (AHI) was 8.2/hour and the total RESPIRATORY DISTURBANCE INDEX was 8.2 /hour.  0 events occurred in REM sleep and 96 events in NREM. The REM AHI was 0 /hour, versus a non-REM AHI of 8.2. The patient spent 127.5 minutes of total sleep time in the supine position and 223 minutes in non-supine.. The supine AHI was 0.9 versus a non-supine AHI of 12.4.  OXYGEN SATURATION & C02:  The Wake baseline 02 saturation was 92%, with the lowest being 89%. Time spent below 89% saturation equaled 0 minutes.  PERIODIC LIMB MOVEMENTS: The patient had a total of 0 Periodic Limb Movements.  The Periodic Limb Movement (PLM) index was 0 and the PLM Arousal index was 0/hour.  Post-study, the patient indicated that sleep was the same as usual.   IMPRESSION:  1. Obstructive Sleep Apnea (OSA) 2. Dysfunctions associated with sleep stages or arousal  from sleep  RECOMMENDATIONS:  1. This study demonstrates overall mild obstructive sleep apnea. Please note that the AHI and O2 nadir during the study and may represent an underestimation of the patient's sleep-disordered breathing since no REM sleep was achieved during this study. Most patients with obstructive sleep apnea have the most frequent and significant apneic or hypopneic events and  more significant desaturations during REM sleep, particularly while in supine REM sleep. Given the patient's medical history and sleep related complaints, a full-night CPAP titration study is recommended to optimize therapy. Other treatment options may include avoidance of supine sleep position along with weight loss, upper airway or jaw surgery in selected patients or the use of an oral appliance in certain patients. ENT evaluation and/or consultation with a maxillofacial surgeon or dentist may be feasible in some instances.    2. Please note that untreated obstructive sleep apnea carries additional perioperative morbidity. Patients with significant obstructive sleep apnea should receive perioperative PAP therapy and the surgeons and particularly the anesthesiologist should be informed of the diagnosis and the severity of the sleep disordered breathing. 3. This study shows sleep fragmentation and abnormal sleep stage percentages; these are nonspecific findings and per se do not signify an intrinsic sleep disorder or a cause for the patient's sleep-related symptoms. Causes include (but are not limited to) the first night effect of the sleep study, circadian rhythm disturbances, medication effect or an underlying mood disorder or medical problem.  4. The patient should be cautioned not to drive, work at heights, or operate dangerous or heavy equipment when tired or sleepy. Review and reiteration of good sleep hygiene measures should be pursued with any patient. 5. The patient will be [facility] discussion of the test results and further management strategies. The referring provider will be notified of the test results.  I certify that I have reviewed the entire raw data recording prior to the issuance of this report in accordance with the Standards of Accreditation of the American Academy of Sleep Medicine (AASM)   Huston FoleySaima Fredrica Capano, MD, PhD Diplomat, American Board of Psychiatry  and Neurology (Neurology and Sleep Medicine)

## 2016-10-26 NOTE — Addendum Note (Signed)
Addended by: Huston FoleyATHAR, Dinna Severs on: 10/26/2016 08:20 AM   Modules accepted: Orders

## 2016-10-26 NOTE — Progress Notes (Signed)
Patient referred by Dr. Lucia GaskinsAhern, seen by me on 09/22/16, diagnostic PSG on 10/25/16.    Please call and notify the patient that the recent sleep study did confirm the diagnosis of mild obstructive sleep apnea. Please note that the AHI and O2 nadir during the study and may represent an underestimation of the patient's sleep-disordered breathing since no REM sleep was achieved during this study. Most patients with obstructive sleep apnea have the most frequent and significant apneic or hypopneic events and more significant desaturations during REM sleep, particularly while in supine REM sleep. Given the patient's medical history and sleep related complaints, I recommend treatment in the form of CPAP. This will require a repeat sleep study for proper titration and mask fitting. Please explain to patient and arrange for a CPAP titration study. I have placed an order in the chart. Thanks, and please route to [facility] for scheduling next sleep study.  Huston FoleySaima Lyrick Worland, MD, PhD [facility])

## 2016-10-27 ENCOUNTER — Telehealth: Payer: Self-pay

## 2016-10-27 DIAGNOSIS — G4733 Obstructive sleep apnea (adult) (pediatric): Secondary | ICD-10-CM

## 2016-10-27 NOTE — Telephone Encounter (Signed)
-----   Message from Huston FoleySaima Athar, MD sent at 10/26/2016  8:20 AM EDT ----- Patient referred by Dr. Lucia GaskinsAhern, seen by me on 09/22/16, diagnostic PSG on 10/25/16.    Please call and notify the patient that the recent sleep study did confirm the diagnosis of mild obstructive sleep apnea. Please note that the AHI and O2 nadir during the study and may represent an underestimation of the patient's sleep-disordered breathing since no REM sleep was achieved during this study. Most patients with obstructive sleep apnea have the most frequent and significant apneic or hypopneic events and more significant desaturations during REM sleep, particularly while in supine REM sleep. Given the patient's medical history and sleep related complaints, I recommend treatment in the form of CPAP. This will require a repeat sleep study for proper titration and mask fitting. Please explain to patient and arrange for a CPAP titration study. I have placed an order in the chart. Thanks, and [facility] next sleep study.  Huston FoleySaima Athar, MD, PhD [facility])

## 2016-10-27 NOTE — Telephone Encounter (Signed)
I called pt. I advised her that Dr. Frances FurbishAthar recommends seeing her dentist for a dental device tx for osa. If her own dentist does not treat osa with dental device, Dr. Frances FurbishAthar has placed a referral to Dr. Toni ArthursFuller. Dr. Frances FurbishAthar will see pt prn. Pt says that she will check with her dentist tomorrow and if needed, make an appt with Dr. Toni ArthursFuller.

## 2016-10-27 NOTE — Telephone Encounter (Signed)
Please refer to Irene LimboSandra Fuller, DDS, fax [phone], ph [phone] for oral appliance.  Referral placed in chart, please notify patient.  She may see her own dentist of course, if he/she provides treatment for OSA with a dental device. She may want to check with her own dentist first.  I can see her back as needed.

## 2016-10-27 NOTE — Telephone Encounter (Signed)
I called pt. I advised her that her PSG did confirm mild osa but in the absence of REM sleep, this may underestimate the true AHI/O2. I explained that that generally more desaturations and apneic events are noted in REM sleep. Dr. Frances FurbishAthar recommends a cpap titration study. Pt is not keen to use a cpap, her husband uses one, and she wants to know if Dr. Frances FurbishAthar recommends anything else for her, such as oral appliance or ENT referral. I did explain several times that the absence of REM sleep underestimates her AHI and that Dr. Frances FurbishAthar recommends cpap, but pt insists that she is not keen on the cpap, but will do it if Dr. Frances FurbishAthar insists that this is the only option. Pt stills to know if she can try alternatives to treat her osa. Pt verbalized understanding of results.

## 2016-10-28 ENCOUNTER — Encounter: Payer: Self-pay | Admitting: Neurology

## 2016-10-28 ENCOUNTER — Ambulatory Visit (INDEPENDENT_AMBULATORY_CARE_PROVIDER_SITE_OTHER): Payer: Commercial Managed Care - PPO | Admitting: Neurology

## 2016-10-28 VITALS — BP 144/92 | HR 87 | Ht 68.5 in

## 2016-10-28 DIAGNOSIS — G43711 Chronic migraine without aura, intractable, with status migrainosus: Secondary | ICD-10-CM

## 2016-10-28 NOTE — Progress Notes (Signed)
Botox-100unitsx2 vials Lot: [id number] Expiration: 05/2019 NDC: 4540-9811-910023-1145-01 [id number]  0.9% Sodium Chloride - 4mL total Lot: [id number] Expiration: 03/19/2018 NDC: 4696-2952-840409-4888-03  Dx: X32.440G43.711 B/B

## 2016-10-28 NOTE — Telephone Encounter (Signed)
Referral has been sent to Dr. Toni ArthursFuller.

## 2016-10-28 NOTE — Progress Notes (Signed)

## 2016-11-09 ENCOUNTER — Telehealth: Payer: Self-pay | Admitting: Neurology

## 2016-11-09 NOTE — Telephone Encounter (Signed)
I called to schedule the patient, she did not answer and had no option for VM.

## 2016-11-16 NOTE — Telephone Encounter (Signed)
Patient has untreated sleep apnea and this can be causing her headaches. Unfortunately nothing we do will help the headaches until she addresses the sleep panea. Botox doesn;t make headaches worse. Please discuss about her sleep apnea and see if she can get an infusion in the infusion center today for migraine if it is very severe.  If not severe Iwe can try a triptan, don;t think she has tried triptans (please ask) like imitrex or relpax thanks

## 2016-11-16 NOTE — Telephone Encounter (Signed)
Patient called and requested to speak with someone regarding a head ache she has been experiencing since getting her injections.  Please call back at [phone].

## 2016-11-16 NOTE — Telephone Encounter (Signed)
Pt states that ever since she had the botox she has had severe headaches for the first week. After the first week her headache slowed down. Pt states that ibuprofen didn't help. Pt states that she is taken the amitriptyline and the propranolol and has not noticed any help with her headaches. Pt is still dealing with stress at work and she is sure that this is probably where it stems from. She wanted to know if the headaches are coming from the botox she received and is there anything else to try to help alleviate the headaches. I informed the patient I would get this information to Dr Lucia Gaskins to get her recommendations and contact her back.

## 2016-11-17 NOTE — Telephone Encounter (Signed)
Called and spoke with the patient and made her aware that we feel this headaches are caused by the untreated sleep apnea. Pt has an apt at the end of the week to get fitted for the dental device so she is in the process of seeking some help with that. Pt is agreeable to trying a medication that could help when the headaches are severe. I confirmed the pharmacy on file and told her we would order and get it sent there.

## 2016-11-18 ENCOUNTER — Other Ambulatory Visit: Payer: Self-pay | Admitting: Neurology

## 2016-11-18 MED ORDER — RIZATRIPTAN BENZOATE 10 MG PO TABS: 10.0000 mg | ORAL_TABLET | ORAL | 0 refills | Status: DC | PRN

## 2016-11-18 NOTE — Telephone Encounter (Signed)
I ordered her some maxalt thanks

## 2017-01-27 ENCOUNTER — Ambulatory Visit: Payer: Commercial Managed Care - PPO | Admitting: Neurology

## 2017-01-28 ENCOUNTER — Ambulatory Visit: Payer: Commercial Managed Care - PPO | Admitting: Neurology

## 2017-02-18 ENCOUNTER — Ambulatory Visit: Payer: Commercial Managed Care - PPO | Admitting: Adult Health

## 2017-02-18 ENCOUNTER — Encounter (INDEPENDENT_AMBULATORY_CARE_PROVIDER_SITE_OTHER): Payer: Self-pay

## 2017-02-18 ENCOUNTER — Encounter: Payer: Self-pay | Admitting: Neurology

## 2017-02-18 VITALS — BP 102/78 | HR 72 | Ht 68.5 in | Wt 220.0 lb

## 2017-02-18 DIAGNOSIS — G43711 Chronic migraine without aura, intractable, with status migrainosus: Secondary | ICD-10-CM | POA: Diagnosis not present

## 2017-02-18 NOTE — Progress Notes (Signed)
     BOTOX PROCEDURE NOTE FOR MIGRAINE HEADACHE    Contraindications and precautions discussed with patient(above). Aseptic procedure was observed and patient tolerated procedure. Procedure performed by Butch PennyMegan Kareli Hossain NP  The condition has existed for more than 6 months, and pt does not have a diagnosis of ALS, Myasthenia Gravis or Lambert-Eaton Syndrome. Risks and benefits of injections discussed and pt agrees to proceed with the procedure. Written consent obtained  These injections are medically necessary. He receives good benefits from these injections. Having 2 headaches/week. These injections do not cause sedations or hallucinations which the oral therapies may cause.  Indication/Diagnosis: chronic migraine BOTOX(J0585) injection was performed according to protocol by [employer]. 200 units of BOTOX was dissolved into 4 cc NS.  NDC: [address]-4000023-1145-01  Type of toxin: Botox  Lot: [id number] Expiration: 07/2019 NDC: 7829-5621-300023-1145-01 [id number]   Description of procedure:  The patient was placed in a sitting position. The standard protocol was used for Botox as follows, with 5 units of Botox injected at each site:   -Procerus muscle, midline injection  -Corrugator muscle, bilateral injection  -Frontalis muscle, bilateral injection, with 2 sites each side, medial injection was performed in the upper one third of the frontalis muscle, in the region vertical from the medial inferior edge of the superior orbital rim. The lateral injection was again in the upper one third of the forehead vertically above the lateral limbus of the cornea, 1.5 cm lateral to the medial injection site.  -Temporalis muscle injection, 4 sites, bilaterally. The first injection was 3 cm above the tragus of the ear, second injection site was 1.5 cm to 3 cm up from the first injection site in line with the tragus of the ear. The third injection site was 1.5-3 cm forward between the first 2 injection sites. The fourth  injection site was 1.5 cm posterior to the second injection site.  -Occipitalis muscle injection, 3 sites, bilaterally. The first injection was done one half way between the occipital protuberance and the tip of the mastoid process behind the ear. The second injection site was done lateral and superior to the first, 1 fingerbreadth from the first injection. The third injection site was 1 fingerbreadth superiorly and medially from the first injection site.  -Cervical paraspinal muscle injection, 2 sites, bilateral knee first injection site was 1 cm from the midline of the cervical spine, 3 cm inferior to the lower border of the occipital protuberance. The second injection site was 1.5 cm superiorly and laterally to the first injection site.  -Trapezius muscle injection was performed at 3 sites, bilaterally. The first injection site was in the upper trapezius muscle halfway between the inflection point of the neck, and the acromion. The second injection site was one half way between the acromion and the first injection site. The third injection was done between the first injection site and the inflection point of the neck.   Will return for repeat injection in 3 months.   A 200 unit sof Botox was used, 155 units were injected, the rest of the Botox was wasted. The patient tolerated the procedure well, there were no complications of the above procedure.

## 2017-02-18 NOTE — Progress Notes (Signed)
Botox-100unitsx2 vials Lot: [id number] Expiration: 07/2019 NDC: [id number] [id number]  0.9% Sodium Chloride- 4mL total Lot: [id number] Expiration: 08/2018 NDC: [id number]  Dx: L24.401G43.711 B/B

## 2017-05-20 ENCOUNTER — Ambulatory Visit: Payer: Commercial Managed Care - PPO | Admitting: Neurology

## 2017-06-25 ENCOUNTER — Other Ambulatory Visit: Payer: Self-pay | Admitting: Neurology

## 2017-06-29 ENCOUNTER — Telehealth: Payer: Self-pay | Admitting: Adult Health

## 2017-06-29 NOTE — Telephone Encounter (Signed)
Pt needing to r/s botox appt

## 2017-06-29 NOTE — Telephone Encounter (Signed)
I called and scheduled the patient.  °

## 2017-07-13 ENCOUNTER — Ambulatory Visit: Payer: Commercial Managed Care - PPO | Admitting: Neurology

## 2017-07-13 ENCOUNTER — Encounter: Payer: Self-pay | Admitting: Neurology

## 2017-07-13 DIAGNOSIS — G43711 Chronic migraine without aura, intractable, with status migrainosus: Secondary | ICD-10-CM

## 2017-07-13 MED ORDER — RIZATRIPTAN BENZOATE 10 MG PO TABS: 10.0000 mg | ORAL_TABLET | ORAL | 11 refills | Status: DC | PRN

## 2017-07-13 MED ORDER — ONDANSETRON HCL 8 MG PO TABS: 8.0000 mg | ORAL_TABLET | Freq: Three times a day (TID) | ORAL | 6 refills | Status: AC | PRN

## 2017-07-13 MED ORDER — PROPRANOLOL HCL 20 MG PO TABS: 20.0000 mg | ORAL_TABLET | Freq: Two times a day (BID) | ORAL | 11 refills | Status: DC

## 2017-07-13 MED ORDER — AMITRIPTYLINE HCL 10 MG PO TABS: 20.0000 mg | ORAL_TABLET | Freq: Every day | ORAL | 11 refills | Status: DC

## 2017-07-13 NOTE — Progress Notes (Signed)
Botox- 100 units x 2 vials Lot: [id number] Expiration: 11/2019 NDC: 1610-9604-54  Bacteriostatic 0.9% Sodium Chloride- 4mL total Lot: [id number] Expiration: 06/17/2018 NDC: 1478-2956-21  Dx: H08.657 B/B

## 2017-07-13 NOTE — Progress Notes (Signed)
Interval history: Sleep test confirmed mild OSA and she was recommended treatment with cpap which she declined, they also discussed a dental device. This is the third botox treatment. Baseline was daily headaches. She has possible 3-5 migraine days a month significant improvement.    Consent Form Botulism Toxin Injection For Chronic Migraine  Botulism toxin has been approved by the Federal drug administration for treatment of chronic migraine. Botulism toxin does not cure chronic migraine and it may not be effective in some patients.  The administration of botulism toxin is accomplished by injecting a small amount of toxin into the muscles of the neck and head. Dosage must be titrated for each individual. Any benefits resulting from botulism toxin tend to wear off after 3 months with a repeat injection required if benefit is to be maintained. Injections are usually done every 3-4 months with maximum effect peak achieved by about 2 or 3 weeks. Botulism toxin is expensive and you should be sure of what costs you will incur resulting from the injection.  The side effects of botulism toxin use for chronic migraine may include:   -Transient, and usually mild, facial weakness with facial injections  -Transient, and usually mild, head or neck weakness with head/neck injections  -Reduction or loss of forehead facial animation due to forehead muscle              weakness  -Eyelid drooping  -Dry eye  -Pain at the site of injection or bruising at the site of injection  -Double vision  -Potential unknown long term risks  Contraindications: You should not have Botox if you are pregnant, nursing, allergic to albumin, have an infection, skin condition, or muscle weakness at the site of the injection, or have myasthenia gravis, Lambert-Eaton syndrome, or ALS.  It is also possible that as with any injection, there may be an allergic reaction or no effect from the medication. Reduced effectiveness after  repeated injections is sometimes seen and rarely infection at the injection site may occur. All care will be taken to prevent these side effects. If therapy is given over a long time, atrophy and wasting in the muscle injected may occur. Occasionally the patient's become refractory to treatment because they develop antibodies to the toxin. In this event, therapy needs to be modified.  I have read the above information and consent to the administration of botulism toxin.    ______________  _____   _________________  Patient signature     Date   Witness signature       BOTOX PROCEDURE NOTE FOR MIGRAINE HEADACHE    Contraindications and precautions discussed with patient(above). Aseptic procedure was observed and patient tolerated procedure. Procedure performed by Dr. Artemio Aly  The condition has existed for more than 6 months, and pt does not have a diagnosis of ALS, Myasthenia Gravis or Lambert-Eaton Syndrome. Risks and benefits of injections discussed and pt agrees to proceed with the procedure. Written consent obtained  These injections are medically necessary. He receives good benefits from these injections. These injections do not cause sedations or hallucinations which the oral therapies may cause.   Description of procedure:  The patient was placed in a sitting position. The standard protocol was used for Botox as follows, with 5 units of Botox injected at each site:   -Procerus muscle, midline injection  -Corrugator muscle, bilateral injection  -Frontalis muscle, bilateral injection, with 2 sites each side, medial injection was performed in the upper one third of the frontalis muscle,  in the region vertical from the medial inferior edge of the superior orbital rim. The lateral injection was again in the upper one third of the forehead vertically above the lateral limbus of the cornea, 1.5 cm lateral to the medial injection site.  -Temporalis muscle injection, 4 sites,  bilaterally. The first injection was 3 cm above the tragus of the ear, second injection site was 1.5 cm to 3 cm up from the first injection site in line with the tragus of the ear. The third injection site was 1.5-3 cm forward between the first 2 injection sites. The fourth injection site was 1.5 cm posterior to the second injection site.  -Occipitalis muscle injection, 3 sites, bilaterally. The first injection was done one half way between the occipital protuberance and the tip of the mastoid process behind the ear. The second injection site was done lateral and superior to the first, 1 fingerbreadth from the first injection. The third injection site was 1 fingerbreadth superiorly and medially from the first injection site.  -Cervical paraspinal muscle injection, 2 sites, bilateral knee first injection site was 1 cm from the midline of the cervical spine, 3 cm inferior to the lower border of the occipital protuberance. The second injection site was 1.5 cm superiorly and laterally to the first injection site.  -Trapezius muscle injection was performed at 3 sites, bilaterally. The first injection site was in the upper trapezius muscle halfway between the inflection point of the neck, and the acromion. The second injection site was one half way between the acromion and the first injection site. The third injection was done between the first injection site and the inflection point of the neck.   Will return for repeat injection in 3 months.   A 200 unit sof Botox was used, 155 units were injected, the rest of the Botox was wasted. The patient tolerated the procedure well, there were no complications of the above procedure.

## 2017-10-14 ENCOUNTER — Ambulatory Visit: Payer: Commercial Managed Care - PPO | Admitting: Neurology

## 2017-10-19 ENCOUNTER — Ambulatory Visit: Payer: Self-pay | Admitting: Adult Health

## 2017-10-19 ENCOUNTER — Ambulatory Visit: Payer: Commercial Managed Care - PPO | Admitting: Adult Health

## 2017-10-19 ENCOUNTER — Ambulatory Visit: Payer: Commercial Managed Care - PPO | Admitting: Neurology

## 2017-10-19 ENCOUNTER — Encounter: Payer: Self-pay | Admitting: Adult Health

## 2017-10-19 VITALS — BP 132/86 | HR 64 | Ht 68.5 in | Wt 213.0 lb

## 2017-10-19 DIAGNOSIS — G43711 Chronic migraine without aura, intractable, with status migrainosus: Secondary | ICD-10-CM

## 2017-10-19 NOTE — Progress Notes (Signed)
BOTOX PROCEDURE NOTE FOR MIGRAINE HEADACHE    Contraindications and precautions discussed with patient(above). Aseptic procedure was observed and patient tolerated procedure. Procedure performed by Butch Penny NP. Consent obtained and form signed.   The condition has existed for more than 6 months, and pt does not have a diagnosis of ALS, Myasthenia Gravis or Lambert-Eaton Syndrome.  Risks and benefits of injections discussed and pt agrees to proceed with the procedure.  Written consent obtained  These injections are medically necessary. Pt  receives good benefits from these injections. These injections do not cause sedations or hallucinations which the oral therapies may cause.  Patient reports that her headaches have improved with Botox.  She now only has approximately 4 migraines a month.    Indication/Diagnosis: chronic migraine BOTOX(J0585) injection was performed according to protocol by [employer]. 200 units of BOTOX was dissolved into 4 cc NS.    Botox-100 units x2 vials Lot: [id number] Expiration:04/2020  NDC 0045-9977-41  [id number]  0.9 Sodium Chloride- 75mL total  Lot [id number]  Expiration: 11/17/2018 NDC: 8616-8372-90    Description of procedure:  The patient was placed in a sitting position. The standard protocol was used for Botox as follows, with 5 units of Botox injected at each site:   -Procerus muscle, midline injection  -Corrugator muscle, bilateral injection  -Frontalis muscle, bilateral injection, with 2 sites each side, medial injection was performed in the upper one third of the frontalis muscle, in the region vertical from the medial inferior edge of the superior orbital rim. The lateral injection was again in the upper one third of the forehead vertically above the lateral limbus of the cornea, 1.5 cm lateral to the medial injection site.  -Temporalis muscle injection, 4 sites, bilaterally. The first injection was 3 cm above the tragus of the ear,  second injection site was 1.5 cm to 3 cm up from the first injection site in line with the tragus of the ear. The third injection site was 1.5-3 cm forward between the first 2 injection sites. The fourth injection site was 1.5 cm posterior to the second injection site.  -Occipitalis muscle injection, 3 sites, bilaterally. The first injection was done one half way between the occipital protuberance and the tip of the mastoid process behind the ear. The second injection site was done lateral and superior to the first, 1 fingerbreadth from the first injection. The third injection site was 1 fingerbreadth superiorly and medially from the first injection site.  -Cervical paraspinal muscle injection, 2 sites, bilateral knee first injection site was 1 cm from the midline of the cervical spine, 3 cm inferior to the lower border of the occipital protuberance. The second injection site was 1.5 cm superiorly and laterally to the first injection site.  -Trapezius muscle injection was performed at 3 sites, bilaterally. The first injection site was in the upper trapezius muscle halfway between the inflection point of the neck, and the acromion. The second injection site was one half way between the acromion and the first injection site. The third injection was done between the first injection site and the inflection point of the neck.   Will return for repeat injection in 3 months.   A 200 unitsof Botox was used, 155 units were injected, the rest of the Botox was wasted. The patient tolerated the procedure well, there were no complications of the above procedure.  Butch Penny, MSN, NP-C 10/19/2017, 4:09 PM [facility] [address] [place], [place] [address] [phone]  

## 2017-10-19 NOTE — Progress Notes (Signed)
Botox-100 units x2 vials Lot: [id number] Expiration:04/2020  NDC 7915-0569-79  [id number]  0.9 Sodium Chloride- 24mL total  Lot [id number]  Expiration: 11/17/2018 NDC: 8675-4492-01   Dx: E07.121 B/B

## 2018-01-28 ENCOUNTER — Ambulatory Visit: Payer: Commercial Managed Care - PPO | Admitting: Neurology

## 2018-01-28 ENCOUNTER — Ambulatory Visit (INDEPENDENT_AMBULATORY_CARE_PROVIDER_SITE_OTHER): Payer: Commercial Managed Care - PPO | Admitting: Neurology

## 2018-01-28 DIAGNOSIS — G43711 Chronic migraine without aura, intractable, with status migrainosus: Secondary | ICD-10-CM | POA: Diagnosis not present

## 2018-01-28 MED ORDER — NORTRIPTYLINE HCL 10 MG PO CAPS: 20.0000 mg | ORAL_CAPSULE | Freq: Every day | ORAL | 6 refills | Status: DC

## 2018-01-28 MED ORDER — FLUOXETINE HCL 40 MG PO CAPS: 40.0000 mg | ORAL_CAPSULE | Freq: Every day | ORAL | 6 refills | Status: DC

## 2018-01-28 NOTE — Progress Notes (Signed)
Botox-100unitsx2 vials Lot: [id number] Expiration: 06/2020 NDC: [id number] [id number]  0.9% Sodium Chloride- 4mL total Lot: [id number] Expiration: 11/2018 NDC: [id number]  Dx: H47.425G43.711 B/B

## 2018-01-28 NOTE — Progress Notes (Signed)
Consent Form Botulism Toxin Injection For Chronic Migraine   Interval history: Sleep test confirmed mild OSA and she was recommended treatment with cpap which she declined, they also discussed a dental device. This is the third botox treatment. Baseline was daily headaches/migraines. She has possible  3-5 migraine days a month only significant improvement.  +LS, +Masseters, +temples, not OO. She stopped the Amitriptyline, nightmares. Start nightmares.    Reviewed orally with patient, additionally signature is on file:  Botulism toxin has been approved by the Federal drug administration for treatment of chronic migraine. Botulism toxin does not cure chronic migraine and it may not be effective in some patients.  The administration of botulism toxin is accomplished by injecting a small amount of toxin into the muscles of the neck and head. Dosage must be titrated for each individual. Any benefits resulting from botulism toxin tend to wear off after 3 months with a repeat injection required if benefit is to be maintained. Injections are usually done every 3-4 months with maximum effect peak achieved by about 2 or 3 weeks. Botulism toxin is expensive and you should be sure of what costs you will incur resulting from the injection.  The side effects of botulism toxin use for chronic migraine may include:   -Transient, and usually mild, facial weakness with facial injections  -Transient, and usually mild, head or neck weakness with head/neck injections  -Reduction or loss of forehead facial animation due to forehead muscle weakness  -Eyelid drooping  -Dry eye  -Pain at the site of injection or bruising at the site of injection  -Double vision  -Potential unknown long term risks  Contraindications: You should not have Botox if you are pregnant, nursing, allergic to albumin, have an infection, skin condition, or muscle weakness at the site of the injection, or have myasthenia gravis,  Lambert-Eaton syndrome, or ALS.  It is also possible that as with any injection, there may be an allergic reaction or no effect from the medication. Reduced effectiveness after repeated injections is sometimes seen and rarely infection at the injection site may occur. All care will be taken to prevent these side effects. If therapy is given over a long time, atrophy and wasting in the muscle injected may occur. Occasionally the patient's become refractory to treatment because they develop antibodies to the toxin. In this event, therapy needs to be modified.  I have read the above information and consent to the administration of botulism toxin.    BOTOX PROCEDURE NOTE FOR MIGRAINE HEADACHE    Contraindications and precautions discussed with patient(above). Aseptic procedure was observed and patient tolerated procedure. Procedure performed by Dr. Artemio Alyoni Ahern  The condition has existed for more than 6 months, and pt does not have a diagnosis of ALS, Myasthenia Gravis or Lambert-Eaton Syndrome.  Risks and benefits of injections discussed and pt agrees to proceed with the procedure.  Written consent obtained  These injections are medically necessary. Pt  receives good benefits from these injections. These injections do not cause sedations or hallucinations which the oral therapies may cause.  Description of procedure:  The patient was placed in a sitting position. The standard protocol was used for Botox as follows, with 5 units of Botox injected at each site:   -Procerus muscle, midline injection  -Corrugator muscle, bilateral injection  -Frontalis muscle, bilateral injection, with 2 sites each side, medial injection was performed in the upper one third of the frontalis muscle, in the region vertical from the medial inferior edge  of the superior orbital rim. The lateral injection was again in the upper one third of the forehead vertically above the lateral limbus of the cornea, 1.5 cm lateral  to the medial injection site.  - Levator Scapulae: 5 units bilaterally  -Temporalis muscle injection, 5 sites, bilaterally. The first injection was 3 cm above the tragus of the ear, second injection site was 1.5 cm to 3 cm up from the first injection site in line with the tragus of the ear. The third injection site was 1.5-3 cm forward between the first 2 injection sites. The fourth injection site was 1.5 cm posterior to the second injection site. 5th site laterally in the temporalis  muscleat the level of the outer canthus.  - Patient feels her clenching is a trigger for headaches. +5 units masseter bilaterally   - Patient feels the migraines are centered around the eyes +5 units bilaterally at the outer canthus in the orbicularis occuli  -Occipitalis muscle injection, 3 sites, bilaterally. The first injection was done one half way between the occipital protuberance and the tip of the mastoid process behind the ear. The second injection site was done lateral and superior to the first, 1 fingerbreadth from the first injection. The third injection site was 1 fingerbreadth superiorly and medially from the first injection site.  -Cervical paraspinal muscle injection, 2 sites, bilateral knee first injection site was 1 cm from the midline of the cervical spine, 3 cm inferior to the lower border of the occipital protuberance. The second injection site was 1.5 cm superiorly and laterally to the first injection site.  -Trapezius muscle injection was performed at 3 sites, bilaterally. The first injection site was in the upper trapezius muscle halfway between the inflection point of the neck, and the acromion. The second injection site was one half way between the acromion and the first injection site. The third injection was done between the first injection site and the inflection point of the neck.   Will return for repeat injection in 3 months.   A 200 unit sof Botox was used, any Botox not injected was  wasted. The patient tolerated the procedure well, there were no complications of the above procedure.

## 2018-04-06 ENCOUNTER — Encounter: Payer: Self-pay | Admitting: Neurology

## 2018-04-07 ENCOUNTER — Telehealth: Payer: Self-pay | Admitting: Neurology

## 2018-04-07 NOTE — Telephone Encounter (Signed)
Patient called and we rescheduled her injection but she stated that the last injection did not seem to help as much as previous ones and she would like to speak with the nurse regarding this. DW

## 2018-04-11 ENCOUNTER — Other Ambulatory Visit: Payer: Self-pay | Admitting: Neurology

## 2018-04-11 DIAGNOSIS — G43711 Chronic migraine without aura, intractable, with status migrainosus: Secondary | ICD-10-CM

## 2018-04-11 MED ORDER — ERENUMAB-AOOE 140 MG/ML ~~LOC~~ SOAJ: 140.0000 mg | SUBCUTANEOUS | 11 refills | Status: DC

## 2018-04-11 NOTE — Telephone Encounter (Signed)
I spoke with her, she has been on steroids since December and I wonder if this is contributing to headaches, she is also under stress and the weather/rain has a great impact on her. Will start Aimovig and see her next month for botox and we will discuss what to do.

## 2018-04-11 NOTE — Telephone Encounter (Signed)
Noted thanks °

## 2018-04-12 ENCOUNTER — Telehealth: Payer: Self-pay

## 2018-04-12 NOTE — Telephone Encounter (Signed)
Pending approval for Aimovig 140 mg Key: [id number] Rx #: [id number]  Received a instant approval  03/13/2018 through 04/12/2019.

## 2018-04-28 ENCOUNTER — Telehealth: Payer: Self-pay | Admitting: Neurology

## 2018-04-28 NOTE — Telephone Encounter (Signed)
I called the patient's insurance and spoke with Rain who stated that the patient was active and covered for codes 83291 and [phone] with [employer]. Ref#[id number].

## 2018-04-29 ENCOUNTER — Ambulatory Visit: Payer: Commercial Managed Care - PPO | Admitting: Neurology

## 2018-05-06 ENCOUNTER — Other Ambulatory Visit: Payer: Self-pay

## 2018-05-06 ENCOUNTER — Ambulatory Visit (INDEPENDENT_AMBULATORY_CARE_PROVIDER_SITE_OTHER): Payer: Commercial Managed Care - PPO | Admitting: Neurology

## 2018-05-06 DIAGNOSIS — G43711 Chronic migraine without aura, intractable, with status migrainosus: Secondary | ICD-10-CM | POA: Diagnosis not present

## 2018-05-06 NOTE — Progress Notes (Signed)
Botox- 100 units x 2 vials Lot: [id number] Expiration: 10/2020 NDC: 0023-1145-01  Bacteriostatic 0.9% Sodium Chloride- 4mL total Lot: [id number] Expiration: 11/17/2018 NDC: 0409-1966-02  Dx: G43.711 B/B   

## 2018-05-06 NOTE — Progress Notes (Signed)
Consent Form Botulism Toxin Injection For Chronic Migraine   Interval history 05/06/2018: Sleep test confirmed mild OSA and she was recommended treatment with cpap which she declined, they also discussed a dental device. This is the fourth botox treatment. Baseline was daily headaches/migraines. She has possible  3-5 migraine days a month only significant improvement.  +LS, +Masseters, +temples, not OO. She stopped the Amitriptyline, nightmares. Started cgrp as well for the rest of the migraines.    Reviewed orally with patient, additionally signature is on file:  Botulism toxin has been approved by the Federal drug administration for treatment of chronic migraine. Botulism toxin does not cure chronic migraine and it may not be effective in some patients.  The administration of botulism toxin is accomplished by injecting a small amount of toxin into the muscles of the neck and head. Dosage must be titrated for each individual. Any benefits resulting from botulism toxin tend to wear off after 3 months with a repeat injection required if benefit is to be maintained. Injections are usually done every 3-4 months with maximum effect peak achieved by about 2 or 3 weeks. Botulism toxin is expensive and you should be sure of what costs you will incur resulting from the injection.  The side effects of botulism toxin use for chronic migraine may include:   -Transient, and usually mild, facial weakness with facial injections  -Transient, and usually mild, head or neck weakness with head/neck injections  -Reduction or loss of forehead facial animation due to forehead muscle weakness  -Eyelid drooping  -Dry eye  -Pain at the site of injection or bruising at the site of injection  -Double vision  -Potential unknown long term risks  Contraindications: You should not have Botox if you are pregnant, nursing, allergic to albumin, have an infection, skin condition, or muscle weakness at the site of the  injection, or have myasthenia gravis, Lambert-Eaton syndrome, or ALS.  It is also possible that as with any injection, there may be an allergic reaction or no effect from the medication. Reduced effectiveness after repeated injections is sometimes seen and rarely infection at the injection site may occur. All care will be taken to prevent these side effects. If therapy is given over a long time, atrophy and wasting in the muscle injected may occur. Occasionally the patient's become refractory to treatment because they develop antibodies to the toxin. In this event, therapy needs to be modified.  I have read the above information and consent to the administration of botulism toxin.    BOTOX PROCEDURE NOTE FOR MIGRAINE HEADACHE    Contraindications and precautions discussed with patient(above). Aseptic procedure was observed and patient tolerated procedure. Procedure performed by Dr. Artemio Aly  The condition has existed for more than 6 months, and pt does not have a diagnosis of ALS, Myasthenia Gravis or Lambert-Eaton Syndrome.  Risks and benefits of injections discussed and pt agrees to proceed with the procedure.  Written consent obtained  These injections are medically necessary. Pt  receives good benefits from these injections. These injections do not cause sedations or hallucinations which the oral therapies may cause.  Description of procedure:  The patient was placed in a sitting position. The standard protocol was used for Botox as follows, with 5 units of Botox injected at each site:   -Procerus muscle, midline injection  -Corrugator muscle, bilateral injection  -Frontalis muscle, bilateral injection, with 2 sites each side, medial injection was performed in the upper one third of the frontalis muscle,  in the region vertical from the medial inferior edge of the superior orbital rim. The lateral injection was again in the upper one third of the forehead vertically above the lateral  limbus of the cornea, 1.5 cm lateral to the medial injection site.  - Levator Scapulae: 5 units bilaterally  -Temporalis muscle injection, 5 sites, bilaterally. The first injection was 3 cm above the tragus of the ear, second injection site was 1.5 cm to 3 cm up from the first injection site in line with the tragus of the ear. The third injection site was 1.5-3 cm forward between the first 2 injection sites. The fourth injection site was 1.5 cm posterior to the second injection site. 5th site laterally in the temporalis  muscleat the level of the outer canthus.  - Patient feels her clenching is a trigger for headaches. +5 units masseter bilaterally   - Patient feels the migraines are centered around the eyes +5 units bilaterally at the outer canthus in the orbicularis occuli  -Occipitalis muscle injection, 3 sites, bilaterally. The first injection was done one half way between the occipital protuberance and the tip of the mastoid process behind the ear. The second injection site was done lateral and superior to the first, 1 fingerbreadth from the first injection. The third injection site was 1 fingerbreadth superiorly and medially from the first injection site.  -Cervical paraspinal muscle injection, 2 sites, bilateral knee first injection site was 1 cm from the midline of the cervical spine, 3 cm inferior to the lower border of the occipital protuberance. The second injection site was 1.5 cm superiorly and laterally to the first injection site.  -Trapezius muscle injection was performed at 3 sites, bilaterally. The first injection site was in the upper trapezius muscle halfway between the inflection point of the neck, and the acromion. The second injection site was one half way between the acromion and the first injection site. The third injection was done between the first injection site and the inflection point of the neck.   Will return for repeat injection in 3 months.   A 200 unit sof Botox  was used, any Botox not injected was wasted. The patient tolerated the procedure well, there were no complications of the above procedure.

## 2018-08-04 ENCOUNTER — Other Ambulatory Visit: Payer: Self-pay | Admitting: Neurology

## 2018-09-14 ENCOUNTER — Encounter: Payer: Self-pay | Admitting: Neurology

## 2018-09-14 ENCOUNTER — Ambulatory Visit (INDEPENDENT_AMBULATORY_CARE_PROVIDER_SITE_OTHER): Payer: Commercial Managed Care - PPO | Admitting: Neurology

## 2018-09-14 ENCOUNTER — Other Ambulatory Visit: Payer: Self-pay

## 2018-09-14 VITALS — BP 142/80 | HR 66 | Temp 97.8°F | Ht 68.5 in | Wt 216.6 lb

## 2018-09-14 DIAGNOSIS — G43711 Chronic migraine without aura, intractable, with status migrainosus: Secondary | ICD-10-CM

## 2018-09-14 NOTE — Progress Notes (Signed)
Consent Form Botulism Toxin Injection For Chronic Migraine   Interval history 09/14/2018: Sleep test confirmed mild OSA and she was recommended treatment with cpap which she declined, they also discussed a dental device. This is the fourth botox treatment. Baseline was daily headaches/migraines. She has possible  3-5 migraine days a month which is a significant improvement.  +LS, +Masseters, +temples, not OO. She stopped the Amitriptyline, nightmares. Started cgrp as well for the rest of the migraines doing well on Aimovig.    Reviewed orally with patient, additionally signature is on file:  Botulism toxin has been approved by the Federal drug administration for treatment of chronic migraine. Botulism toxin does not cure chronic migraine and it may not be effective in some patients.  The administration of botulism toxin is accomplished by injecting a small amount of toxin into the muscles of the neck and head. Dosage must be titrated for each individual. Any benefits resulting from botulism toxin tend to wear off after 3 months with a repeat injection required if benefit is to be maintained. Injections are usually done every 3-4 months with maximum effect peak achieved by about 2 or 3 weeks. Botulism toxin is expensive and you should be sure of what costs you will incur resulting from the injection.  The side effects of botulism toxin use for chronic migraine may include:   -Transient, and usually mild, facial weakness with facial injections  -Transient, and usually mild, head or neck weakness with head/neck injections  -Reduction or loss of forehead facial animation due to forehead muscle weakness  -Eyelid drooping  -Dry eye  -Pain at the site of injection or bruising at the site of injection  -Double vision  -Potential unknown long term risks  Contraindications: You should not have Botox if you are pregnant, nursing, allergic to albumin, have an infection, skin condition, or muscle  weakness at the site of the injection, or have myasthenia gravis, Lambert-Eaton syndrome, or ALS.  It is also possible that as with any injection, there may be an allergic reaction or no effect from the medication. Reduced effectiveness after repeated injections is sometimes seen and rarely infection at the injection site may occur. All care will be taken to prevent these side effects. If therapy is given over a long time, atrophy and wasting in the muscle injected may occur. Occasionally the patient's become refractory to treatment because they develop antibodies to the toxin. In this event, therapy needs to be modified.  I have read the above information and consent to the administration of botulism toxin.    BOTOX PROCEDURE NOTE FOR MIGRAINE HEADACHE    Contraindications and precautions discussed with patient(above). Aseptic procedure was observed and patient tolerated procedure. Procedure performed by Dr. Artemio Alyoni Lyriq Finerty  The condition has existed for more than 6 months, and pt does not have a diagnosis of ALS, Myasthenia Gravis or Lambert-Eaton Syndrome.  Risks and benefits of injections discussed and pt agrees to proceed with the procedure.  Written consent obtained  These injections are medically necessary. Pt  receives good benefits from these injections. These injections do not cause sedations or hallucinations which the oral therapies may cause.  Description of procedure:  The patient was placed in a sitting position. The standard protocol was used for Botox as follows, with 5 units of Botox injected at each site:   -Procerus muscle, midline injection  -Corrugator muscle, bilateral injection  -Frontalis muscle, bilateral injection, with 2 sites each side, medial injection was performed in the upper  one third of the frontalis muscle, in the region vertical from the medial inferior edge of the superior orbital rim. The lateral injection was again in the upper one third of the forehead  vertically above the lateral limbus of the cornea, 1.5 cm lateral to the medial injection site.  - Levator Scapulae: 5 units bilaterally  -Temporalis muscle injection, 5 sites, bilaterally. The first injection was 3 cm above the tragus of the ear, second injection site was 1.5 cm to 3 cm up from the first injection site in line with the tragus of the ear. The third injection site was 1.5-3 cm forward between the first 2 injection sites. The fourth injection site was 1.5 cm posterior to the second injection site. 5th site laterally in the temporalis  muscleat the level of the outer canthus.  - Patient feels her clenching is a trigger for headaches. +5 units masseter bilaterally   - Patient feels the migraines are centered around the eyes +5 units bilaterally at the outer canthus in the orbicularis occuli  -Occipitalis muscle injection, 3 sites, bilaterally. The first injection was done one half way between the occipital protuberance and the tip of the mastoid process behind the ear. The second injection site was done lateral and superior to the first, 1 fingerbreadth from the first injection. The third injection site was 1 fingerbreadth superiorly and medially from the first injection site.  -Cervical paraspinal muscle injection, 2 sites, bilateral knee first injection site was 1 cm from the midline of the cervical spine, 3 cm inferior to the lower border of the occipital protuberance. The second injection site was 1.5 cm superiorly and laterally to the first injection site.  -Trapezius muscle injection was performed at 3 sites, bilaterally. The first injection site was in the upper trapezius muscle halfway between the inflection point of the neck, and the acromion. The second injection site was one half way between the acromion and the first injection site. The third injection was done between the first injection site and the inflection point of the neck.   Will return for repeat injection in 3  months.   A 200 unit sof Botox was used, any Botox not injected was wasted. The patient tolerated the procedure well, there were no complications of the above procedure.

## 2018-09-14 NOTE — Progress Notes (Signed)
**  Botox 200 units x 1 vial, NDC 4854-6270-35, Lot [id number], Exp 12/2020, office supply.//mck**

## 2018-09-17 ENCOUNTER — Other Ambulatory Visit: Payer: Self-pay | Admitting: Neurology

## 2018-11-24 ENCOUNTER — Other Ambulatory Visit: Payer: Self-pay | Admitting: Neurology

## 2018-11-24 NOTE — Telephone Encounter (Signed)
Called, LVM for pt to call office to discuss refill request. We last sent in refill 01/28/2018 #30, 6 refills. She would have ran out of med around 08/2018. Wondering if she is still taking or if another provider is prescribing for her now?

## 2018-12-12 ENCOUNTER — Telehealth: Payer: Self-pay | Admitting: Neurology

## 2018-12-12 NOTE — Telephone Encounter (Signed)
Pt called wanting to inform RN that she is still taking FLUoxetine (PROZAC) 40 MG capsule Please advise.

## 2018-12-13 ENCOUNTER — Other Ambulatory Visit: Payer: Self-pay | Admitting: Neurology

## 2018-12-13 NOTE — Telephone Encounter (Addendum)
I called the pt back. She stated there were some times she skipped on the weekends or didn't get to the pharmacy to refill. She is taking 40 mg once daily and trying to get back on track. She verbalized appreciation for the call.   Fluoxetine 40 mg refill to be sent to pharmacy pending MD approval.

## 2018-12-21 ENCOUNTER — Other Ambulatory Visit: Payer: Self-pay

## 2018-12-21 ENCOUNTER — Ambulatory Visit: Payer: Commercial Managed Care - PPO | Admitting: Neurology

## 2018-12-21 VITALS — Temp 97.4°F

## 2018-12-21 DIAGNOSIS — G43711 Chronic migraine without aura, intractable, with status migrainosus: Secondary | ICD-10-CM

## 2018-12-21 NOTE — Progress Notes (Signed)
Consent Form Botulism Toxin Injection For Chronic Migraine   Interval history 12/21/2018: Sleep test confirmed mild OSA and she was recommended treatment with cpap which she declined, they also discussed a dental device. This is the fourth botox treatment. Baseline was daily headaches/migraines. She has possible  3-5 migraine days a month which is a significant improvement.  +a  She stopped the Amitriptyline, nightmares. Started cgrp as well for the rest of the migraines doing well on Aimovig.    Consent Form Botulism Toxin Injection For Chronic Migraine    Reviewed orally with patient, additionally signature is on file:  Botulism toxin has been approved by the Federal drug administration for treatment of chronic migraine. Botulism toxin does not cure chronic migraine and it may not be effective in some patients.  The administration of botulism toxin is accomplished by injecting a small amount of toxin into the muscles of the neck and head. Dosage must be titrated for each individual. Any benefits resulting from botulism toxin tend to wear off after 3 months with a repeat injection required if benefit is to be maintained. Injections are usually done every 3-4 months with maximum effect peak achieved by about 2 or 3 weeks. Botulism toxin is expensive and you should be sure of what costs you will incur resulting from the injection.  The side effects of botulism toxin use for chronic migraine may include:   -Transient, and usually mild, facial weakness with facial injections  -Transient, and usually mild, head or neck weakness with head/neck injections  -Reduction or loss of forehead facial animation due to forehead muscle weakness  -Eyelid drooping  -Dry eye  -Pain at the site of injection or bruising at the site of injection  -Double vision  -Potential unknown long term risks  Contraindications: You should not have Botox if you are pregnant, nursing, allergic to albumin, have an  infection, skin condition, or muscle weakness at the site of the injection, or have myasthenia gravis, Lambert-Eaton syndrome, or ALS.  It is also possible that as with any injection, there may be an allergic reaction or no effect from the medication. Reduced effectiveness after repeated injections is sometimes seen and rarely infection at the injection site may occur. All care will be taken to prevent these side effects. If therapy is given over a long time, atrophy and wasting in the muscle injected may occur. Occasionally the patient's become refractory to treatment because they develop antibodies to the toxin. In this event, therapy needs to be modified.  I have read the above information and consent to the administration of botulism toxin.    BOTOX PROCEDURE NOTE FOR MIGRAINE HEADACHE    Contraindications and precautions discussed with patient(above). Aseptic procedure was observed and patient tolerated procedure. Procedure performed by Dr. Artemio Aly  The condition has existed for more than 6 months, and pt does not have a diagnosis of ALS, Myasthenia Gravis or Lambert-Eaton Syndrome.  Risks and benefits of injections discussed and pt agrees to proceed with the procedure.  Written consent obtained  These injections are medically necessary. Pt  receives good benefits from these injections. These injections do not cause sedations or hallucinations which the oral therapies may cause.  Description of procedure:  The patient was placed in a sitting position. The standard protocol was used for Botox as follows, with 5 units of Botox injected at each site:   -Procerus muscle, midline injection  -Corrugator muscle, bilateral injection  -Frontalis muscle, bilateral injection, with 2 sites each  side, medial injection was performed in the upper one third of the frontalis muscle, in the region vertical from the medial inferior edge of the superior orbital rim. The lateral injection was again in  the upper one third of the forehead vertically above the lateral limbus of the cornea, 1.5 cm lateral to the medial injection site.  -Temporalis muscle injection, 4 sites, bilaterally. The first injection was 3 cm above the tragus of the ear, second injection site was 1.5 cm to 3 cm up from the first injection site in line with the tragus of the ear. The third injection site was 1.5-3 cm forward between the first 2 injection sites. The fourth injection site was 1.5 cm posterior to the second injection site.   -Occipitalis muscle injection, 3 sites, bilaterally. The first injection was done one half way between the occipital protuberance and the tip of the mastoid process behind the ear. The second injection site was done lateral and superior to the first, 1 fingerbreadth from the first injection. The third injection site was 1 fingerbreadth superiorly and medially from the first injection site.  -Cervical paraspinal muscle injection, 2 sites, bilateral knee first injection site was 1 cm from the midline of the cervical spine, 3 cm inferior to the lower border of the occipital protuberance. The second injection site was 1.5 cm superiorly and laterally to the first injection site.  -Trapezius muscle injection was performed at 3 sites, bilaterally. The first injection site was in the upper trapezius muscle halfway between the inflection point of the neck, and the acromion. The second injection site was one half way between the acromion and the first injection site. The third injection was done between the first injection site and the inflection point of the neck.   Will return for repeat injection in 3 months.   200 units of Botox was used, any Botox not injected was wasted. The patient tolerated the procedure well, there were no complications of the above procedure.

## 2018-12-21 NOTE — Progress Notes (Signed)
Botox- 200 units x 1 vial Lot: [id number] Expiration: 07/2021 NDC: 2694-8546-27  Bacteriostatic 0.9% Sodium Chloride- 93mL total Lot: [id number] Expiration: 08/17/2019 NDC: 3818-2993-71  Dx: I96.789 B/B

## 2019-02-17 ENCOUNTER — Other Ambulatory Visit: Payer: Self-pay | Admitting: Neurology

## 2019-03-22 ENCOUNTER — Telehealth: Payer: Self-pay

## 2019-03-22 MED ORDER — BOTOX 100 UNITS IJ SOLR: INTRAMUSCULAR | 1 refills | Status: DC

## 2019-03-22 NOTE — Telephone Encounter (Signed)
Noted, thank you. DWD  

## 2019-03-22 NOTE — Telephone Encounter (Signed)
Toma Copier could you send a script for this [facility] B[facility] to [facility]?

## 2019-03-22 NOTE — Telephone Encounter (Signed)
Refill sent to [facility].  

## 2019-03-23 ENCOUNTER — Telehealth: Payer: Self-pay | Admitting: *Deleted

## 2019-03-23 NOTE — Telephone Encounter (Signed)
Completed Aimovig PA on CMM. Key: [id number]. Approved immediately by [facility]. CaseId: [id number]; Status: Approved; Review Type: Prior Auth; Coverage Start Date: 02/21/2019; Coverage End Date: 03/22/2020.

## 2019-03-27 ENCOUNTER — Telehealth: Payer: Self-pay

## 2019-03-27 ENCOUNTER — Telehealth: Payer: Self-pay | Admitting: Adult Health

## 2019-03-27 NOTE — Telephone Encounter (Signed)
Eneo with the PA dept of [facility] is asking for a call from Botox coordinator, when calling please ref Case [place]#[id number], Duwayne Heck can speak with anyone

## 2019-03-27 NOTE — Telephone Encounter (Signed)
I called the patients insurance and checked code [id number] and [phone] to make sure they were covered and check pre cert requirements. Codes are valid and billable with [employer] ref#[id number]

## 2019-03-28 ENCOUNTER — Other Ambulatory Visit: Payer: Self-pay

## 2019-03-28 ENCOUNTER — Ambulatory Visit: Payer: Commercial Managed Care - PPO | Admitting: Neurology

## 2019-03-28 VITALS — Temp 97.3°F

## 2019-03-28 DIAGNOSIS — G43711 Chronic migraine without aura, intractable, with status migrainosus: Secondary | ICD-10-CM

## 2019-03-28 MED ORDER — NURTEC 75 MG PO TBDP: 75.0000 mg | ORAL_TABLET | Freq: Every day | ORAL | 6 refills | Status: AC | PRN

## 2019-03-28 NOTE — Telephone Encounter (Signed)
Noted, patient will not need medication for a few months so I will call back closer to time. DWD

## 2019-03-28 NOTE — Progress Notes (Signed)
Consent Form Botulism Toxin Injection For Chronic Migraine   Interval history 03/27/2018: Sleep test confirmed mild OSA and she was recommended treatment with cpap which she declined, they also discussed a dental device which she uses and feels she may clenching more bc has more pain around the right templs, getting more headaches since botox wearing off, but also due for aimovig go ahead and take the aimovig a little sooner to help while the botox starts to work again.. Baseline was daily headaches/migraines. She has possible  She is under stress. Still 3-5 migraine days a month which is a significant improvement.  +a  She stopped the Amitriptyline, nightmares. Started cgrp as well for the rest of the migraines doing well on Aimovig. Can try nurtec acutely, maxalt as well.  Meds ordered this encounter  Medications  . Rimegepant Sulfate (NURTEC) 75 MG TBDP    Sig: Take 75 mg by mouth daily as needed. For migraines. Take as close to onset of migraine as possible. One daily maximum.    Dispense:  10 tablet    Refill:  6    Consent Form Botulism Toxin Injection For Chronic Migraine    Reviewed orally with patient, additionally signature is on file:  Botulism toxin has been approved by the Federal drug administration for treatment of chronic migraine. Botulism toxin does not cure chronic migraine and it may not be effective in some patients.  The administration of botulism toxin is accomplished by injecting a small amount of toxin into the muscles of the neck and head. Dosage must be titrated for each individual. Any benefits resulting from botulism toxin tend to wear off after 3 months with a repeat injection required if benefit is to be maintained. Injections are usually done every 3-4 months with maximum effect peak achieved by about 2 or 3 weeks. Botulism toxin is expensive and you should be sure of what costs you will incur resulting from the injection.  The side effects of botulism toxin  use for chronic migraine may include:   -Transient, and usually mild, facial weakness with facial injections  -Transient, and usually mild, head or neck weakness with head/neck injections  -Reduction or loss of forehead facial animation due to forehead muscle weakness  -Eyelid drooping  -Dry eye  -Pain at the site of injection or bruising at the site of injection  -Double vision  -Potential unknown long term risks  Contraindications: You should not have Botox if you are pregnant, nursing, allergic to albumin, have an infection, skin condition, or muscle weakness at the site of the injection, or have myasthenia gravis, Lambert-Eaton syndrome, or ALS.  It is also possible that as with any injection, there may be an allergic reaction or no effect from the medication. Reduced effectiveness after repeated injections is sometimes seen and rarely infection at the injection site may occur. All care will be taken to prevent these side effects. If therapy is given over a long time, atrophy and wasting in the muscle injected may occur. Occasionally the patient's become refractory to treatment because they develop antibodies to the toxin. In this event, therapy needs to be modified.  I have read the above information and consent to the administration of botulism toxin.    BOTOX PROCEDURE NOTE FOR MIGRAINE HEADACHE    Contraindications and precautions discussed with patient(above). Aseptic procedure was observed and patient tolerated procedure. Procedure performed by Dr. Georgia Dom  The condition has existed for more than 6 months, and pt does not have  a diagnosis of ALS, Myasthenia Gravis or Lambert-Eaton Syndrome.  Risks and benefits of injections discussed and pt agrees to proceed with the procedure.  Written consent obtained  These injections are medically necessary. Pt  receives good benefits from these injections. These injections do not cause sedations or hallucinations which the oral therapies  may cause.  Description of procedure:  The patient was placed in a sitting position. The standard protocol was used for Botox as follows, with 5 units of Botox injected at each site:   -Procerus muscle, midline injection  -Corrugator muscle, bilateral injection  -Frontalis muscle, bilateral injection, with 2 sites each side, medial injection was performed in the upper one third of the frontalis muscle, in the region vertical from the medial inferior edge of the superior orbital rim. The lateral injection was again in the upper one third of the forehead vertically above the lateral limbus of the cornea, 1.5 cm lateral to the medial injection site.  -Temporalis muscle injection, 4 sites, bilaterally. The first injection was 3 cm above the tragus of the ear, second injection site was 1.5 cm to 3 cm up from the first injection site in line with the tragus of the ear. The third injection site was 1.5-3 cm forward between the first 2 injection sites. The fourth injection site was 1.5 cm posterior to the second injection site.   -Occipitalis muscle injection, 3 sites, bilaterally. The first injection was done one half way between the occipital protuberance and the tip of the mastoid process behind the ear. The second injection site was done lateral and superior to the first, 1 fingerbreadth from the first injection. The third injection site was 1 fingerbreadth superiorly and medially from the first injection site.  -Cervical paraspinal muscle injection, 2 sites, bilateral knee first injection site was 1 cm from the midline of the cervical spine, 3 cm inferior to the lower border of the occipital protuberance. The second injection site was 1.5 cm superiorly and laterally to the first injection site.  -Trapezius muscle injection was performed at 3 sites, bilaterally. The first injection site was in the upper trapezius muscle halfway between the inflection point of the neck, and the acromion. The second  injection site was one half way between the acromion and the first injection site. The third injection was done between the first injection site and the inflection point of the neck.   Will return for repeat injection in 3 months.   200 units of Botox was used, any Botox not injected was wasted. The patient tolerated the procedure well, there were no complications of the above procedure.

## 2019-03-28 NOTE — Progress Notes (Signed)
Botox- 200 units x 1 vials Lot: [id number] Expiration: 11/2021 NDC: 5003-7048-88  Bacteriostatic 0.9% Sodium Chloride- 7mL total Lot: [id number] Expiration: 05/18/2019 NDC: 0388-8280-03  Dx: K91.791 B/B

## 2019-03-31 NOTE — Telephone Encounter (Signed)
[facility] coordinator called asking about PA for the patients botox referred to below message and states they will put medication on hold if PA is not received within 48 hrs FYI   CB#[phone]  ZM#[id number]

## 2019-04-05 NOTE — Telephone Encounter (Signed)
Noted, thank you. DWD  

## 2019-05-12 ENCOUNTER — Other Ambulatory Visit: Payer: Self-pay | Admitting: Neurology

## 2019-05-12 DIAGNOSIS — G43711 Chronic migraine without aura, intractable, with status migrainosus: Secondary | ICD-10-CM

## 2019-05-16 ENCOUNTER — Other Ambulatory Visit: Payer: Self-pay | Admitting: Neurology

## 2019-06-08 ENCOUNTER — Telehealth: Payer: Self-pay | Admitting: *Deleted

## 2019-06-08 NOTE — Telephone Encounter (Signed)
Patient has an appointment on 06/27/2019.  I called [employer] [phone] and spoke to [place]. She states that J0585 and 28786 are valid and billable.  They do not require PA.  Eligible for B/B  Ref# for this call is [id number]

## 2019-06-27 ENCOUNTER — Ambulatory Visit: Payer: Commercial Managed Care - PPO | Admitting: Neurology

## 2019-06-27 ENCOUNTER — Other Ambulatory Visit: Payer: Self-pay

## 2019-06-27 VITALS — Temp 97.1°F

## 2019-06-27 DIAGNOSIS — G43711 Chronic migraine without aura, intractable, with status migrainosus: Secondary | ICD-10-CM | POA: Diagnosis not present

## 2019-06-27 MED ORDER — AIMOVIG 70 MG/ML ~~LOC~~ SOAJ: 70.0000 mg | SUBCUTANEOUS | 0 refills | Status: DC

## 2019-06-27 NOTE — Progress Notes (Signed)
Botox- 200 units x 1 vial Lot: [id number] Expiration: 01/2022 NDC: 0023-3921-02  Bacteriostatic 0.9% Sodium Chloride- 4mL total Lot: [id number] Expiration: 08/17/2019 NDC: 0409-1966-02  Dx: G43.711 B/B  

## 2019-06-27 NOTE — Progress Notes (Signed)
Consent Form Botulism Toxin Injection For Chronic Migraine   Interval history 06/27/2019: Sleep test confirmed mild OSA and she was recommended treatment with cpap which she declined, they also discussed a dental device which she uses and feels she may clenching more bc has more pain around the right templs, getting more headaches since botox wearing off, but also due for aimovig go ahead and take the aimovig a little sooner to help while the botox starts to work again.. Baseline was daily headaches/migraines. She has possible  She is under stress. Still 3-5 migraine days a month which is a significant improvement.  +a  She stopped the Amitriptyline, nightmares. Started cgrp as well for the rest of the migraines doing well on Aimovig. She takes nurtec acutely, maxalt as well which help. Put 10 extra units around right above the ear as that is where the migraines start, ask if it helped, gave some aimovig samples in case she needs a booster if aimovig and botox come due at the same time.     Consent Form Botulism Toxin Injection For Chronic Migraine    Reviewed orally with patient, additionally signature is on file:  Botulism toxin has been approved by the Federal drug administration for treatment of chronic migraine. Botulism toxin does not cure chronic migraine and it may not be effective in some patients.  The administration of botulism toxin is accomplished by injecting a small amount of toxin into the muscles of the neck and head. Dosage must be titrated for each individual. Any benefits resulting from botulism toxin tend to wear off after 3 months with a repeat injection required if benefit is to be maintained. Injections are usually done every 3-4 months with maximum effect peak achieved by about 2 or 3 weeks. Botulism toxin is expensive and you should be sure of what costs you will incur resulting from the injection.  The side effects of botulism toxin use for chronic migraine may  include:   -Transient, and usually mild, facial weakness with facial injections  -Transient, and usually mild, head or neck weakness with head/neck injections  -Reduction or loss of forehead facial animation due to forehead muscle weakness  -Eyelid drooping  -Dry eye  -Pain at the site of injection or bruising at the site of injection  -Double vision  -Potential unknown long term risks  Contraindications: You should not have Botox if you are pregnant, nursing, allergic to albumin, have an infection, skin condition, or muscle weakness at the site of the injection, or have myasthenia gravis, Lambert-Eaton syndrome, or ALS.  It is also possible that as with any injection, there may be an allergic reaction or no effect from the medication. Reduced effectiveness after repeated injections is sometimes seen and rarely infection at the injection site may occur. All care will be taken to prevent these side effects. If therapy is given over a long time, atrophy and wasting in the muscle injected may occur. Occasionally the patient's become refractory to treatment because they develop antibodies to the toxin. In this event, therapy needs to be modified.  I have read the above information and consent to the administration of botulism toxin.    BOTOX PROCEDURE NOTE FOR MIGRAINE HEADACHE    Contraindications and precautions discussed with patient(above). Aseptic procedure was observed and patient tolerated procedure. Procedure performed by Dr. Artemio Aly  The condition has existed for more than 6 months, and pt does not have a diagnosis of ALS, Myasthenia Gravis or Lambert-Eaton Syndrome.  Risks  and benefits of injections discussed and pt agrees to proceed with the procedure.  Written consent obtained  These injections are medically necessary. Pt  receives good benefits from these injections. These injections do not cause sedations or hallucinations which the oral therapies may cause.  Description of  procedure:  The patient was placed in a sitting position. The standard protocol was used for Botox as follows, with 5 units of Botox injected at each site:   -Procerus muscle, midline injection  -Corrugator muscle, bilateral injection  -Frontalis muscle, bilateral injection, with 2 sites each side, medial injection was performed in the upper one third of the frontalis muscle, in the region vertical from the medial inferior edge of the superior orbital rim. The lateral injection was again in the upper one third of the forehead vertically above the lateral limbus of the cornea, 1.5 cm lateral to the medial injection site.  -Temporalis muscle injection, 4 sites, bilaterally. The first injection was 3 cm above the tragus of the ear, second injection site was 1.5 cm to 3 cm up from the first injection site in line with the tragus of the ear. The third injection site was 1.5-3 cm forward between the first 2 injection sites. The fourth injection site was 1.5 cm posterior to the second injection site.   -Occipitalis muscle injection, 3 sites, bilaterally. The first injection was done one half way between the occipital protuberance and the tip of the mastoid process behind the ear. The second injection site was done lateral and superior to the first, 1 fingerbreadth from the first injection. The third injection site was 1 fingerbreadth superiorly and medially from the first injection site.  -Cervical paraspinal muscle injection, 2 sites, bilateral knee first injection site was 1 cm from the midline of the cervical spine, 3 cm inferior to the lower border of the occipital protuberance. The second injection site was 1.5 cm superiorly and laterally to the first injection site.  -Trapezius muscle injection was performed at 3 sites, bilaterally. The first injection site was in the upper trapezius muscle halfway between the inflection point of the neck, and the acromion. The second injection site was one half way  between the acromion and the first injection site. The third injection was done between the first injection site and the inflection point of the neck.   Will return for repeat injection in 3 months.   200 units of Botox was used, any Botox not injected was wasted. The patient tolerated the procedure well, there were no complications of the above procedure.

## 2019-06-27 NOTE — Addendum Note (Signed)
Addended by: Naomie Dean B on: 06/27/2019 08:44 AM   Modules accepted: Orders

## 2019-07-14 ENCOUNTER — Other Ambulatory Visit: Payer: Self-pay | Admitting: Neurology

## 2019-08-18 ENCOUNTER — Other Ambulatory Visit: Payer: Self-pay | Admitting: Neurology

## 2019-09-10 ENCOUNTER — Other Ambulatory Visit: Payer: Self-pay | Admitting: Neurology

## 2019-09-27 ENCOUNTER — Telehealth: Payer: Self-pay | Admitting: Neurology

## 2019-09-27 MED ORDER — BOTOX 100 UNITS IJ SOLR: INTRAMUSCULAR | 1 refills | Status: DC

## 2019-09-27 NOTE — Telephone Encounter (Signed)
I spoke with Chrishana with [employer] she stated it does require a PA for Botox. We started the PA and was able to get it approved. AuthWilfred Curtis (exp. 11/14/19 to 02/12/20) 90 days for 4 visits.   I then spoke with My[place] and she stated that the special pharmacy to use [facility].I called [facility] and spoke with Angelica and she states the patient is not on file.  When you get a chance can you send the [facility] RX to [facility]. Thank you!

## 2019-09-27 NOTE — Telephone Encounter (Signed)
I called patient because she had a Botox injection scheduled for tomorrow. I noticed that patient's previous insurance coverage is now inactive. I spoke with patient to see if there has been a change. She states that she now has [employer]. She provided me with the new ID number of ([id number]) and the provider services number [phone]. She also mentioned that she is dealing with congestion and feels that it is best to cancel her appointment tomorrow. We rescheduled it for next available of 9/28.

## 2019-09-27 NOTE — Addendum Note (Signed)
Addended by: Bertram Savin on: 09/27/2019 12:09 PM   Modules accepted: Orders

## 2019-09-27 NOTE — Telephone Encounter (Signed)
Would you mind calling her new insurance to see if PA is required for [id number] and [id number]?

## 2019-09-27 NOTE — Telephone Encounter (Signed)
done

## 2019-09-28 ENCOUNTER — Ambulatory Visit: Payer: Commercial Managed Care - PPO | Admitting: Neurology

## 2019-10-02 NOTE — Telephone Encounter (Signed)
I called [facility] to see if I could set up delivery. I gave them the PA number from [facility] but they stated it does not go through pharmacy benefits that it needs to go through major medical benefits. I then called [facility] again and spoke with [place] M. She stated she only see's the plan lasting until 10/18/19 and that it will term or is only paid up to 10/18/19 so that may be why [facility] would not fill the botox.   I called the patient and informed her up this. She stated her plan is not suppose to term on 10/18/19 its suppose to be from 08/17/19 to 08/15/20. She stated that she did get something in the mail with her insurance and she was going to give them a call back. She stated if she gets any information she would pass it on to me..  I informed her I would have to wait until after 10/18/19 to see if I can get the botox filled.

## 2019-10-09 NOTE — Telephone Encounter (Signed)
Received fax from [facility] requesting diagnosis code for patient. I attached the diagnosis code of G43.711 and faxed back to [phone].

## 2019-10-12 NOTE — Telephone Encounter (Signed)
(  2) 100U vials of Botox delivered today from [facility] for patient's 9/28 appointment.

## 2019-11-09 ENCOUNTER — Other Ambulatory Visit: Payer: Self-pay | Admitting: Neurology

## 2019-11-14 ENCOUNTER — Ambulatory Visit (INDEPENDENT_AMBULATORY_CARE_PROVIDER_SITE_OTHER): Payer: BC Managed Care – PPO | Admitting: Neurology

## 2019-11-14 DIAGNOSIS — G43711 Chronic migraine without aura, intractable, with status migrainosus: Secondary | ICD-10-CM | POA: Diagnosis not present

## 2019-11-14 NOTE — Progress Notes (Signed)
Consent Form Botulism Toxin Injection For Chronic Migraine    Interval history 11/14/2019: Sleep test confirmed mild OSA and she was recommended treatment with cpap which she declined, they also discussed a dental device which she uses and feels she may clenching more bc has more pain around the right templs, getting more headaches since botox wearing off, but also due for aimovig go ahead and take the aimovig a little sooner to help while the botox starts to work again.. Baseline was daily headaches/migraines. She has possible  She is under stress. Still 3-5 migraine days a month which is a significant improvement.  +a  She stopped the Amitriptyline, nightmares. Started cgrp as well for the rest of the migraines doing well on Aimovig. She takes nurtec acutely, maxalt as well which help. Put 10 extra units around right above the ear as that is where the migraines start, ask if it helped, gave some aimovig samples in case she needs a booster if aimovig and botox come due at the same time.   +5u orbic occuli bilat +5 u masseters bilat Put 10 extra units around right above the ear as that is where the migraines start    Consent Form Botulism Toxin Injection For Chronic Migraine    Reviewed orally with patient, additionally signature is on file:  Botulism toxin has been approved by the Federal drug administration for treatment of chronic migraine. Botulism toxin does not cure chronic migraine and it may not be effective in some patients.  The administration of botulism toxin is accomplished by injecting a small amount of toxin into the muscles of the neck and head. Dosage must be titrated for each individual. Any benefits resulting from botulism toxin tend to wear off after 3 months with a repeat injection required if benefit is to be maintained. Injections are usually done every 3-4 months with maximum effect peak achieved by about 2 or 3 weeks. Botulism toxin is expensive and you should be sure  of what costs you will incur resulting from the injection.  The side effects of botulism toxin use for chronic migraine may include:   -Transient, and usually mild, facial weakness with facial injections  -Transient, and usually mild, head or neck weakness with head/neck injections  -Reduction or loss of forehead facial animation due to forehead muscle weakness  -Eyelid drooping  -Dry eye  -Pain at the site of injection or bruising at the site of injection  -Double vision  -Potential unknown long term risks  Contraindications: You should not have Botox if you are pregnant, nursing, allergic to albumin, have an infection, skin condition, or muscle weakness at the site of the injection, or have myasthenia gravis, Lambert-Eaton syndrome, or ALS.  It is also possible that as with any injection, there may be an allergic reaction or no effect from the medication. Reduced effectiveness after repeated injections is sometimes seen and rarely infection at the injection site may occur. All care will be taken to prevent these side effects. If therapy is given over a long time, atrophy and wasting in the muscle injected may occur. Occasionally the patient's become refractory to treatment because they develop antibodies to the toxin. In this event, therapy needs to be modified.  I have read the above information and consent to the administration of botulism toxin.    BOTOX PROCEDURE NOTE FOR MIGRAINE HEADACHE    Contraindications and precautions discussed with patient(above). Aseptic procedure was observed and patient tolerated procedure. Procedure performed by Dr. Artemio Aly  The condition has existed for more than 6 months, and pt does not have a diagnosis of ALS, Myasthenia Gravis or Lambert-Eaton Syndrome.  Risks and benefits of injections discussed and pt agrees to proceed with the procedure.  Written consent obtained  These injections are medically necessary. Pt  receives good benefits from  these injections. These injections do not cause sedations or hallucinations which the oral therapies may cause.  Description of procedure:  The patient was placed in a sitting position. The standard protocol was used for Botox as follows, with 5 units of Botox injected at each site:   -Procerus muscle, midline injection  -Corrugator muscle, bilateral injection  -Frontalis muscle, bilateral injection, with 2 sites each side, medial injection was performed in the upper one third of the frontalis muscle, in the region vertical from the medial inferior edge of the superior orbital rim. The lateral injection was again in the upper one third of the forehead vertically above the lateral limbus of the cornea, 1.5 cm lateral to the medial injection site.  -Temporalis muscle injection, 4 sites, bilaterally. The first injection was 3 cm above the tragus of the ear, second injection site was 1.5 cm to 3 cm up from the first injection site in line with the tragus of the ear. The third injection site was 1.5-3 cm forward between the first 2 injection sites. The fourth injection site was 1.5 cm posterior to the second injection site.   -Occipitalis muscle injection, 3 sites, bilaterally. The first injection was done one half way between the occipital protuberance and the tip of the mastoid process behind the ear. The second injection site was done lateral and superior to the first, 1 fingerbreadth from the first injection. The third injection site was 1 fingerbreadth superiorly and medially from the first injection site.  -Cervical paraspinal muscle injection, 2 sites, bilateral knee first injection site was 1 cm from the midline of the cervical spine, 3 cm inferior to the lower border of the occipital protuberance. The second injection site was 1.5 cm superiorly and laterally to the first injection site.  -Trapezius muscle injection was performed at 3 sites, bilaterally. The first injection site was in the  upper trapezius muscle halfway between the inflection point of the neck, and the acromion. The second injection site was one half way between the acromion and the first injection site. The third injection was done between the first injection site and the inflection point of the neck.   Will return for repeat injection in 3 months.   200 units of Botox was used, any Botox not injected was wasted. The patient tolerated the procedure well, there were no complications of the above procedure.

## 2019-11-14 NOTE — Progress Notes (Signed)
Botox- 100 units x 2 vials Lot: [id number] Expiration: 12/2021 NDC: 6606-3016-01  Bacteriostatic 0.9% Sodium Chloride- 69mL total Lot: [id number] Expiration: 11/16/2020 NDC: 5732-2025-42  Dx: H06.237 S/P

## 2020-01-03 ENCOUNTER — Telehealth: Payer: Self-pay | Admitting: Family Medicine

## 2020-01-03 NOTE — Telephone Encounter (Signed)
Patient's next Botox appointment is 02/21/20. I received a fax from [facility] stating Botox will be delivered 01/04/20.

## 2020-01-04 NOTE — Telephone Encounter (Signed)
(  2) 100U vials of Botox delivered today from [facility]. 

## 2020-01-14 ENCOUNTER — Other Ambulatory Visit: Payer: Self-pay | Admitting: Neurology

## 2020-02-21 ENCOUNTER — Ambulatory Visit: Payer: Self-pay | Admitting: Family Medicine

## 2020-02-26 NOTE — Telephone Encounter (Signed)
I called the patient today because I noticed she cancelled her appointment on 02/21/20. She states she received a letter from her insurance company stating that Botox would no longer be covered. I requested that she send a copy of that letter via MyChart so I can look in to this.

## 2020-03-10 ENCOUNTER — Encounter: Payer: Self-pay | Admitting: Emergency Medicine

## 2020-03-10 ENCOUNTER — Emergency Department (INDEPENDENT_AMBULATORY_CARE_PROVIDER_SITE_OTHER): Payer: BC Managed Care – PPO

## 2020-03-10 ENCOUNTER — Emergency Department (INDEPENDENT_AMBULATORY_CARE_PROVIDER_SITE_OTHER)
Admission: EM | Admit: 2020-03-10 | Discharge: 2020-03-10 | Disposition: A | Discharge status: Home / Self Care | Payer: BC Managed Care – PPO | Source: Home / Self Care

## 2020-03-10 ENCOUNTER — Other Ambulatory Visit: Payer: Self-pay

## 2020-03-10 DIAGNOSIS — M79672 Pain in left foot: Secondary | ICD-10-CM | POA: Diagnosis not present

## 2020-03-10 DIAGNOSIS — M25562 Pain in left knee: Secondary | ICD-10-CM

## 2020-03-10 DIAGNOSIS — W010XXA Fall on same level from slipping, tripping and stumbling without subsequent striking against object, initial encounter: Secondary | ICD-10-CM

## 2020-03-10 DIAGNOSIS — M25572 Pain in left ankle and joints of left foot: Secondary | ICD-10-CM

## 2020-03-10 DIAGNOSIS — M545 Low back pain, unspecified: Secondary | ICD-10-CM

## 2020-03-10 DIAGNOSIS — M25462 Effusion, left knee: Secondary | ICD-10-CM

## 2020-03-10 DIAGNOSIS — W000XXA Fall on same level due to ice and snow, initial encounter: Secondary | ICD-10-CM | POA: Diagnosis not present

## 2020-03-10 DIAGNOSIS — S8002XA Contusion of left knee, initial encounter: Secondary | ICD-10-CM

## 2020-03-10 NOTE — ED Triage Notes (Signed)
Patient states that she fell Friday night coming out of work.  She is having left leg, knee, foot and low back pain.  Patient is taking Ibuprofen for the discomfort.  No loss of consciousness.

## 2020-03-10 NOTE — Discharge Instructions (Signed)
  You may take 500mg  acetaminophen every 4-6 hours or in combination with ibuprofen 400-600mg  every 6-8 hours as needed for pain and inflammation.  Call tomorrow to schedule a follow up appointment with Sports Medicine this week for further evaluation and treatment.

## 2020-03-10 NOTE — ED Provider Notes (Signed)
[facility]    CSN: [id number] Arrival date & time: 03/10/20  1345      History   Chief Complaint Chief Complaint  Patient presents with  . Fall    HPI Ann Bailey is a 50 y.o. female.   HPI Ann Bailey is a 50 y.o. female presenting to UC with c/o Left low back pain, left knee pain, Left ankle and foot pain after slip and falling on Left side of buttock on ice while walking out of work 2 days ago. She denies hitting her knee but noticed bruising and worsening pain to medial aspect. Pain is aching and sore, worse with movement and weightbearing. Denies hitting her head during the fall.     Past Medical History:  Diagnosis Date  . Arthritis   . 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 Diabetes mellitus without complication (HCC)   . Goiter   . Headache   . Hypothyroidism     Patient Active Problem List   Diagnosis Date Noted  . 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 APPENDECTOMY    . CESAREAN SECTION    . left knee arthoroscpy twice     . WISDOM TOOTH EXTRACTION      OB History   No obstetric history on file.      Home Medications    Prior to Admission medications   Medication Sig Start Date End Date Taking? Authorizing Provider  AIMOVIG 140 MG/ML SOAJ INJECT 140 MG INTO THE SKIN EVERY 30 (THIRTY) DAYS. 05/15/19  Yes Anson Fret, MD  atorvastatin (LIPITOR) 40 MG tablet Take by mouth. 12/22/19  Yes [provider]  Dulaglutide (TRULICITY) 0.75 MG/0.5ML SOPN Inject into the skin. 01/30/20  Yes [provider]  Erenumab-aooe (AIMOVIG) 70 MG/ML SOAJ Inject 70 mg into the skin every 30 (thirty) days. 06/27/19  Yes Anson Fret, MD  FLUoxetine (PROZAC) 40 MG capsule TAKE 1 CAPSULE BY MOUTH EVERY DAY 01/14/20  Yes Anson Fret, MD  HYDROcodone-acetaminophen (NORCO/VICODIN) 5-325 MG tablet Take by mouth. 09/29/18   Yes [provider]  ibuprofen (ADVIL,MOTRIN) 800 MG tablet prn 06/20/10  Yes [provider]  levonorgestrel (MIRENA) 20 MCG/24HR IUD by Intrauterine route.   Yes [provider]  levothyroxine (SYNTHROID, LEVOTHROID) 200 MCG tablet Take 200 mcg by mouth daily before breakfast.    Yes [provider]  meclizine (ANTIVERT) 12.5 MG tablet Take by mouth 2 (two) times daily as needed.    Yes [provider]  pioglitazone (ACTOS) 30 MG tablet Take by mouth. 12/22/19  Yes [provider]  predniSONE (DELTASONE) 5 MG tablet Take 5 mg by mouth daily. 04/21/18  Yes [provider]  propranolol (INDERAL) 20 MG tablet TAKE 1 TABLET BY MOUTH TWICE A DAY 08/22/19  Yes Anson Fret, MD  Rimegepant Sulfate (NURTEC) 75 MG TBDP Take 75 mg by mouth daily as needed. For migraines. Take as close to onset of migraine as possible. One daily maximum. 03/28/19  Yes Anson Fret, MD  rizatriptan (MAXALT) 10 MG tablet TAKE 1 TABLET BY MOUTH AS NEEDED FOR MIGRAINE. MAY REPEAT IN 2 HOURS IF NEEDED. MAX 1TAB TWICE DAILY 09/18/18  Yes Anson Fret, MD  Upadacitinib ER (RINVOQ) 15 MG TB24 Take by mouth. 12/12/19  Yes [provider]  atorvastatin (LIPITOR) 10 MG tablet Take 10 mg by mouth. 05/12/16 07/13/17  [provider]  botulinum toxin Type A (BOTOX) 100 units SOLR injection Provider to inject 155 units into the muscles of the head and neck every 3 months. Discard remainder. 09/27/19   Anson FretAhern, Antonia B, MD  metFORMIN (GLUCOPHAGE-XR) 500 MG 24 hr tablet Take 1,000 mg by mouth daily with breakfast.  07/30/16 07/30/17  [provider]  OLUMIANT 2 MG TABS  04/20/18   [provider]  ondansetron (ZOFRAN) 8 MG tablet Take 1 tablet (8 mg total) by mouth every 8 (eight) hours as needed for nausea or vomiting. 07/13/17   Anson FretAhern, Antonia B, MD  sulfaSALAzine (AZULFIDINE) 500 MG EC tablet TAKE THREE TABLETS (1,500 MG TOTAL) BY MOUTH 2 (TWO)  TIMES DAILY. 06/16/16   [provider]    Family History Family History  Problem Relation Age of Onset  . Migraines Neg Hx     Social History Social History   Tobacco Use  . Smoking status: Never Smoker  . Smokeless tobacco: Never Used  Substance Use Topics  . Alcohol use: Yes    Alcohol/week: 1.0 standard drink    Types: 1 Shots of liquor per week    Comment: occasionally  . Drug use: No     Allergies   Latex   Review of Systems Review of Systems  Musculoskeletal: Positive for arthralgias and joint swelling.  Skin: Positive for color change. Negative for wound.  Neurological: Negative for dizziness, light-headedness and headaches.     Physical Exam Triage Vital Signs ED Triage Vitals [03/10/20 1540]  Enc Vitals Group     BP 134/85     Pulse Rate 84     Resp      Temp      Temp src      SpO2 95 %     Weight      Height      Head Circumference      Peak Flow      Pain Score 6     Pain Loc      Pain Edu?      Excl. in GC?    No data found.  Updated Vital Signs BP 134/85 (BP Location: Left Arm)   Pulse 84   SpO2 95%   Visual Acuity Right Eye Distance:   Left Eye Distance:   Bilateral Distance:    Right Eye Near:   Left Eye Near:    Bilateral Near:     Physical Exam Vitals and nursing note reviewed.  Constitutional:      General: She is not in acute distress.    Appearance: Normal appearance. She is well-developed and well-nourished. She is obese. She is not ill-appearing, toxic-appearing or diaphoretic.  HENT:     Head: Normocephalic and atraumatic.  Eyes:     Extraocular Movements: EOM normal.  Cardiovascular:     Rate and Rhythm: Normal rate.  Pulmonary:     Effort: Pulmonary effort is normal. No respiratory distress.  Musculoskeletal:        General: Swelling and tenderness present. Normal range of motion.     Cervical back: Normal range of motion.     Comments: No spinal tenderness Mild Left lower lumbar muscular  tenderness Left hip: non-tender, full ROM Left knee: mild to moderate edema, tenderness to medial aspect, bruising noted.  Calf is soft, non-tender. Left ankle and foot: mild edema, tenderness to lateral and dorsal aspect.   Skin:    General: Skin is warm and dry.  Capillary Refill: Capillary refill takes less than 2 seconds.     Findings: Bruising present.  Neurological:     Mental Status: She is alert and oriented to person, place, and time.     Sensory: No sensory deficit.  Psychiatric:        Mood and Affect: Mood and affect normal.        Behavior: Behavior normal.      UC Treatments / Results  Labs (all labs ordered are listed, but only abnormal results are displayed) Labs Reviewed - No data to display  EKG   Radiology DG Ankle Complete Left  Result Date: 03/10/2020 CLINICAL DATA:  Lateral left foot and ankle pain and swelling after fall EXAM: LEFT ANKLE COMPLETE - 3+ VIEW; LEFT FOOT - COMPLETE 3+ VIEW COMPARISON:  None. FINDINGS: No evidence of acute fracture. No malalignment. Chronic appearing irregularity along the inferior tip of the medial malleolus, likely reflecting sequela of remote trauma. Enthesophyte present at the Achilles tendon insertion. Joint spaces are maintained. No soft tissue swelling. IMPRESSION: No acute osseous abnormality, left foot or ankle. Electronically Signed   By: Duanne Guess D.O.   On: 03/10/2020 16:36   DG Knee Complete 4 Views Left  Result Date: 03/10/2020 CLINICAL DATA:  Left knee pain after fall EXAM: LEFT KNEE - COMPLETE 4+ VIEW COMPARISON:  None. FINDINGS: No evidence of fracture, dislocation, or joint effusion. No evidence of arthropathy or other focal bone abnormality. Soft tissues are unremarkable. IMPRESSION: Negative. Electronically Signed   By: Duanne Guess D.O.   On: 03/10/2020 16:34   DG Foot Complete Left  Result Date: 03/10/2020 CLINICAL DATA:  Lateral left foot and ankle pain and swelling after fall EXAM: LEFT  ANKLE COMPLETE - 3+ VIEW; LEFT FOOT - COMPLETE 3+ VIEW COMPARISON:  None. FINDINGS: No evidence of acute fracture. No malalignment. Chronic appearing irregularity along the inferior tip of the medial malleolus, likely reflecting sequela of remote trauma. Enthesophyte present at the Achilles tendon insertion. Joint spaces are maintained. No soft tissue swelling. IMPRESSION: No acute osseous abnormality, left foot or ankle. Electronically Signed   By: Duanne Guess D.O.   On: 03/10/2020 16:36    Procedures Procedures (including critical care time)  Medications Ordered in UC Medications - No data to display  Initial Impression / Assessment and Plan / UC Course  I have reviewed the triage vital signs and the nursing notes.  Pertinent labs & imaging results that were available during my care of the patient were reviewed by me and considered in my medical decision making (see chart for details).     Discussed imaging with pt Pt placed in knee splint for comfort Pt declined crutches Encouraged f/u with Sports Medicine later this week for further evaluation and treatment. Pt declined work note, states she already called out for tomorrow.   Final Clinical Impressions(s) / UC Diagnoses   Final diagnoses:  Fall from slip, trip, or stumble, initial encounter  Contusion of left knee, initial encounter  Pain and swelling of left knee  Acute left ankle pain  Left foot pain  Acute left-sided low back pain without sciatica     Discharge Instructions      You may take 500mg  acetaminophen every 4-6 hours or in combination with ibuprofen 400-600mg  every 6-8 hours as needed for pain and inflammation.  Call tomorrow to schedule a follow up appointment with Sports Medicine this week for further evaluation and treatment.    ED Prescriptions  None     I have reviewed the PDMP during this encounter.   Lurene Shadow, [place] 03/10/20 1746

## 2020-03-13 ENCOUNTER — Other Ambulatory Visit: Payer: Self-pay | Admitting: Neurology

## 2020-03-13 NOTE — Telephone Encounter (Addendum)
I called [employer] and spoke with Nate to check PA requirements for [phone] and [id number]. Nate states that no PA is required for 02542, but J0585 will require PA. He was able to initiate PA and get it approved for 4 doses. PA #[id number]. PA will expire in January 2023. Patient will continue to use [facility]. Patient already has (2) 100 vials here.

## 2020-03-18 ENCOUNTER — Other Ambulatory Visit: Payer: Self-pay | Admitting: Neurology

## 2020-04-18 ENCOUNTER — Telehealth: Payer: Self-pay

## 2020-04-18 NOTE — Telephone Encounter (Signed)
PA for Aimovig has been submitted for this pt through cover my meds.   (Key: [id number]) Your information has been submitted to [employer]. To check for an updated outcome later, reopen this PA request from your dashboard.  If [employer] has not responded to your request within 24 hours, contact [employer] at [phone]. If you think there may be a problem with your PA request, use our live chat feature at the bottom right.

## 2020-04-29 MED ORDER — AJOVY 225 MG/1.5ML ~~LOC~~ SOAJ: 225.0000 mg | SUBCUTANEOUS | 3 refills | Status: DC

## 2020-04-29 NOTE — Telephone Encounter (Signed)
Spoke with patient today. She scheduled botox for this Wed 3/16 at 2 pm with Dr Lucia Gaskins. I let the patient know we would call her back if there were any issues. She will also call if she is unable to leave work for the appt.

## 2020-04-29 NOTE — Addendum Note (Signed)
Addended by: Bertram Savin on: 04/29/2020 01:50 PM   Modules accepted: Orders

## 2020-04-29 NOTE — Telephone Encounter (Signed)
Sure, please discuss with her and either is fine

## 2020-04-29 NOTE — Telephone Encounter (Signed)
Per [facility] [employer], Aimovig denied.  Patient must try/fail BOTH Ajovy and Emgality.   Appeals can be faxed to [phone]. PA# [facility] [phone]

## 2020-04-29 NOTE — Telephone Encounter (Signed)
Spoke with patient and discussed that insurance isn't covering Aimovig this year and the preferred drugs are [employer] and Ajovy. The patient would like to try Ajovy instead. I have canceled this in her chart and sent the Ajovy 225 mg SOAJ prescription to [facility] per v.o. Dr Lucia Gaskins. The patient understands this dosing schedule is also every 30 days like the Aimovig. Pt is aware there is a savings card that she can use even if insurance denies the Ajovy. Her questions were answered. We also scheduled the patient for her next botox appointment. She verbalized appreciation for the call.

## 2020-04-30 NOTE — Progress Notes (Deleted)
Consent Form Botulism Toxin Injection For Chronic Migraine    Interval history 11/14/2019: Sleep test confirmed mild OSA and she was recommended treatment with cpap which she declined, they also discussed a dental device which she uses and feels she may clenching more bc has more pain around the right templs, getting more headaches since botox wearing off, but also due for aimovig go ahead and take the aimovig a little sooner to help while the botox starts to work again.. Baseline was daily headaches/migraines. She has possible  She is under stress. Still 3-5 migraine days a month which is a significant improvement.  +a  She stopped the Amitriptyline, nightmares. Started cgrp as well for the rest of the migraines doing well on Aimovig. She takes nurtec acutely, maxalt as well which help. Put 10 extra units around right above the ear as that is where the migraines start, ask if it helped, gave some aimovig samples in case she needs a booster if aimovig and botox come due at the same time.   +5u orbic occuli bilat +5 u masseters bilat Put 10 extra units around right above the ear as that is where the migraines start    Consent Form Botulism Toxin Injection For Chronic Migraine    Reviewed orally with patient, additionally signature is on file:  Botulism toxin has been approved by the Federal drug administration for treatment of chronic migraine. Botulism toxin does not cure chronic migraine and it may not be effective in some patients.  The administration of botulism toxin is accomplished by injecting a small amount of toxin into the muscles of the neck and head. Dosage must be titrated for each individual. Any benefits resulting from botulism toxin tend to wear off after 3 months with a repeat injection required if benefit is to be maintained. Injections are usually done every 3-4 months with maximum effect peak achieved by about 2 or 3 weeks. Botulism toxin is expensive and you should be sure  of what costs you will incur resulting from the injection.  The side effects of botulism toxin use for chronic migraine may include:   -Transient, and usually mild, facial weakness with facial injections  -Transient, and usually mild, head or neck weakness with head/neck injections  -Reduction or loss of forehead facial animation due to forehead muscle weakness  -Eyelid drooping  -Dry eye  -Pain at the site of injection or bruising at the site of injection  -Double vision  -Potential unknown long term risks  Contraindications: You should not have Botox if you are pregnant, nursing, allergic to albumin, have an infection, skin condition, or muscle weakness at the site of the injection, or have myasthenia gravis, Lambert-Eaton syndrome, or ALS.  It is also possible that as with any injection, there may be an allergic reaction or no effect from the medication. Reduced effectiveness after repeated injections is sometimes seen and rarely infection at the injection site may occur. All care will be taken to prevent these side effects. If therapy is given over a long time, atrophy and wasting in the muscle injected may occur. Occasionally the patient's become refractory to treatment because they develop antibodies to the toxin. In this event, therapy needs to be modified.  I have read the above information and consent to the administration of botulism toxin.    BOTOX PROCEDURE NOTE FOR MIGRAINE HEADACHE    Contraindications and precautions discussed with patient(above). Aseptic procedure was observed and patient tolerated procedure. Procedure performed by Dr. Artemio Aly  The condition has existed for more than 6 months, and pt does not have a diagnosis of ALS, Myasthenia Gravis or Lambert-Eaton Syndrome.  Risks and benefits of injections discussed and pt agrees to proceed with the procedure.  Written consent obtained  These injections are medically necessary. Pt  receives good benefits from  these injections. These injections do not cause sedations or hallucinations which the oral therapies may cause.  Description of procedure:  The patient was placed in a sitting position. The standard protocol was used for Botox as follows, with 5 units of Botox injected at each site:   -Procerus muscle, midline injection  -Corrugator muscle, bilateral injection  -Frontalis muscle, bilateral injection, with 2 sites each side, medial injection was performed in the upper one third of the frontalis muscle, in the region vertical from the medial inferior edge of the superior orbital rim. The lateral injection was again in the upper one third of the forehead vertically above the lateral limbus of the cornea, 1.5 cm lateral to the medial injection site.  -Temporalis muscle injection, 4 sites, bilaterally. The first injection was 3 cm above the tragus of the ear, second injection site was 1.5 cm to 3 cm up from the first injection site in line with the tragus of the ear. The third injection site was 1.5-3 cm forward between the first 2 injection sites. The fourth injection site was 1.5 cm posterior to the second injection site.   -Occipitalis muscle injection, 3 sites, bilaterally. The first injection was done one half way between the occipital protuberance and the tip of the mastoid process behind the ear. The second injection site was done lateral and superior to the first, 1 fingerbreadth from the first injection. The third injection site was 1 fingerbreadth superiorly and medially from the first injection site.  -Cervical paraspinal muscle injection, 2 sites, bilateral knee first injection site was 1 cm from the midline of the cervical spine, 3 cm inferior to the lower border of the occipital protuberance. The second injection site was 1.5 cm superiorly and laterally to the first injection site.  -Trapezius muscle injection was performed at 3 sites, bilaterally. The first injection site was in the  upper trapezius muscle halfway between the inflection point of the neck, and the acromion. The second injection site was one half way between the acromion and the first injection site. The third injection was done between the first injection site and the inflection point of the neck.   Will return for repeat injection in 3 months.   200 units of Botox was used, any Botox not injected was wasted. The patient tolerated the procedure well, there were no complications of the above procedure.     

## 2020-05-01 ENCOUNTER — Ambulatory Visit: Payer: BC Managed Care – PPO | Admitting: Neurology

## 2020-05-25 ENCOUNTER — Other Ambulatory Visit: Payer: Self-pay | Admitting: Neurology

## 2020-07-23 NOTE — Telephone Encounter (Signed)
Patient LVM regarding wanting to schedule an appointment for Botox injections. Are we able to work patient in anywhere? Or should I offer next available? Let me know and I will call patient.

## 2020-07-24 NOTE — Telephone Encounter (Signed)
I called patient and LVM offering 6/21 at 4:30. Requested she call back to confirm.

## 2020-08-06 ENCOUNTER — Ambulatory Visit (INDEPENDENT_AMBULATORY_CARE_PROVIDER_SITE_OTHER): Payer: BC Managed Care – PPO | Admitting: Neurology

## 2020-08-06 VITALS — Wt 214.4 lb

## 2020-08-06 DIAGNOSIS — G43711 Chronic migraine without aura, intractable, with status migrainosus: Secondary | ICD-10-CM

## 2020-08-06 NOTE — Progress Notes (Signed)
Consent Form Botulism Toxin Injection For Chronic Migraine   08/06/2020: She had a problem with her insurance. +a. Had insurance problems and is back to start again. +extra around her right temple and occipital emergence points.    Interval history 11/14/2019: Sleep test confirmed mild OSA and she was recommended treatment with cpap which she declined, they also discussed a dental device which she uses and feels she may clenching more bc has more pain around the right templs, getting more headaches since botox wearing off, but also due for aimovig go ahead and take the aimovig a little sooner to help while the botox starts to work again.. Baseline was daily headaches/migraines. She has possible  She is under stress. Still 3-5 migraine days a month which is a significant improvement.  +a  She stopped the Amitriptyline, nightmares. Started cgrp as well for the rest of the migraines doing well on Aimovig. She takes nurtec acutely, maxalt as well which help. Put 10 extra units around right above the ear as that is where the migraines start, ask if it helped, gave some aimovig samples in case she needs a booster if aimovig and botox come due at the same time.   +5u orbic occuli bilat +5 u masseters bilat Put 10 extra units around right above the ear as that is where the migraines start    Consent Form Botulism Toxin Injection For Chronic Migraine    Reviewed orally with patient, additionally signature is on file:  Botulism toxin has been approved by the Federal drug administration for treatment of chronic migraine. Botulism toxin does not cure chronic migraine and it may not be effective in some patients.  The administration of botulism toxin is accomplished by injecting a small amount of toxin into the muscles of the neck and head. Dosage must be titrated for each individual. Any benefits resulting from botulism toxin tend to wear off after 3 months with a repeat injection required if benefit is  to be maintained. Injections are usually done every 3-4 months with maximum effect peak achieved by about 2 or 3 weeks. Botulism toxin is expensive and you should be sure of what costs you will incur resulting from the injection.  The side effects of botulism toxin use for chronic migraine may include:   -Transient, and usually mild, facial weakness with facial injections  -Transient, and usually mild, head or neck weakness with head/neck injections  -Reduction or loss of forehead facial animation due to forehead muscle weakness  -Eyelid drooping  -Dry eye  -Pain at the site of injection or bruising at the site of injection  -Double vision  -Potential unknown long term risks  Contraindications: You should not have Botox if you are pregnant, nursing, allergic to albumin, have an infection, skin condition, or muscle weakness at the site of the injection, or have myasthenia gravis, Lambert-Eaton syndrome, or ALS.  It is also possible that as with any injection, there may be an allergic reaction or no effect from the medication. Reduced effectiveness after repeated injections is sometimes seen and rarely infection at the injection site may occur. All care will be taken to prevent these side effects. If therapy is given over a long time, atrophy and wasting in the muscle injected may occur. Occasionally the patient's become refractory to treatment because they develop antibodies to the toxin. In this event, therapy needs to be modified.  I have read the above information and consent to the administration of botulism toxin.  BOTOX PROCEDURE NOTE FOR MIGRAINE HEADACHE    Contraindications and precautions discussed with patient(above). Aseptic procedure was observed and patient tolerated procedure. Procedure performed by Dr. Artemio Aly  The condition has existed for more than 6 months, and pt does not have a diagnosis of ALS, Myasthenia Gravis or Lambert-Eaton Syndrome.  Risks and benefits of  injections discussed and pt agrees to proceed with the procedure.  Written consent obtained  These injections are medically necessary. Pt  receives good benefits from these injections. These injections do not cause sedations or hallucinations which the oral therapies may cause.  Description of procedure:  The patient was placed in a sitting position. The standard protocol was used for Botox as follows, with 5 units of Botox injected at each site:   -Procerus muscle, midline injection  -Corrugator muscle, bilateral injection  -Frontalis muscle, bilateral injection, with 2 sites each side, medial injection was performed in the upper one third of the frontalis muscle, in the region vertical from the medial inferior edge of the superior orbital rim. The lateral injection was again in the upper one third of the forehead vertically above the lateral limbus of the cornea, 1.5 cm lateral to the medial injection site.  -Temporalis muscle injection, 4 sites, bilaterally. The first injection was 3 cm above the tragus of the ear, second injection site was 1.5 cm to 3 cm up from the first injection site in line with the tragus of the ear. The third injection site was 1.5-3 cm forward between the first 2 injection sites. The fourth injection site was 1.5 cm posterior to the second injection site.   -Occipitalis muscle injection, 3 sites, bilaterally. The first injection was done one half way between the occipital protuberance and the tip of the mastoid process behind the ear. The second injection site was done lateral and superior to the first, 1 fingerbreadth from the first injection. The third injection site was 1 fingerbreadth superiorly and medially from the first injection site.  -Cervical paraspinal muscle injection, 2 sites, bilateral knee first injection site was 1 cm from the midline of the cervical spine, 3 cm inferior to the lower border of the occipital protuberance. The second injection site was  1.5 cm superiorly and laterally to the first injection site.  -Trapezius muscle injection was performed at 3 sites, bilaterally. The first injection site was in the upper trapezius muscle halfway between the inflection point of the neck, and the acromion. The second injection site was one half way between the acromion and the first injection site. The third injection was done between the first injection site and the inflection point of the neck.   Will return for repeat injection in 3 months.   200 units of Botox was used, any Botox not injected was wasted. The patient tolerated the procedure well, there were no complications of the above procedure.

## 2020-08-06 NOTE — Progress Notes (Signed)
Botox- 100 units x 2 vials Lot: [id number] Expiration: 03/2022 NDC: 8648-4720-72  Bacteriostatic 0.9% Sodium Chloride- 100mL total Lot: [id number] Expiration: 09/06/2020 NDC: 3744-5146-04  Dx: N99.872. S/P

## 2020-10-28 ENCOUNTER — Telehealth: Payer: Self-pay | Admitting: Neurology

## 2020-10-28 NOTE — Telephone Encounter (Signed)
Patient has a Botox appointment 9/22. I called [facility] @ [phone] and was told patient's prescription has expired. I was transferred to the pharmacy to provide a verbal prescription. Advised appt is 9/22.

## 2020-10-30 NOTE — Telephone Encounter (Signed)
I called [facility] and spoke with Desiree to check status. She states the order is still pending insurance verification.

## 2020-11-03 ENCOUNTER — Other Ambulatory Visit: Payer: Self-pay | Admitting: Neurology

## 2020-11-07 ENCOUNTER — Encounter: Payer: Self-pay | Admitting: Neurology

## 2020-11-07 ENCOUNTER — Ambulatory Visit: Payer: BC Managed Care – PPO | Admitting: Neurology

## 2020-11-07 DIAGNOSIS — G43711 Chronic migraine without aura, intractable, with status migrainosus: Secondary | ICD-10-CM | POA: Diagnosis not present

## 2020-11-07 NOTE — Progress Notes (Signed)
Consent Form Botulism Toxin Injection For Chronic Migraine   11/07/2020: She had a problem with her insurance so we restarted in June 2022. Again getting great relief >60% improvement in frequency and severity +a.    Consent Form Botulism Toxin Injection For Chronic Migraine    Reviewed orally with patient, additionally signature is on file:  Botulism toxin has been approved by the Federal drug administration for treatment of chronic migraine. Botulism toxin does not cure chronic migraine and it may not be effective in some patients.  The administration of botulism toxin is accomplished by injecting a small amount of toxin into the muscles of the neck and head. Dosage must be titrated for each individual. Any benefits resulting from botulism toxin tend to wear off after 3 months with a repeat injection required if benefit is to be maintained. Injections are usually done every 3-4 months with maximum effect peak achieved by about 2 or 3 weeks. Botulism toxin is expensive and you should be sure of what costs you will incur resulting from the injection.  The side effects of botulism toxin use for chronic migraine may include:   -Transient, and usually mild, facial weakness with facial injections  -Transient, and usually mild, head or neck weakness with head/neck injections  -Reduction or loss of forehead facial animation due to forehead muscle weakness  -Eyelid drooping  -Dry eye  -Pain at the site of injection or bruising at the site of injection  -Double vision  -Potential unknown long term risks  Contraindications: You should not have Botox if you are pregnant, nursing, allergic to albumin, have an infection, skin condition, or muscle weakness at the site of the injection, or have myasthenia gravis, Lambert-Eaton syndrome, or ALS.  It is also possible that as with any injection, there may be an allergic reaction or no effect from the medication. Reduced effectiveness after repeated  injections is sometimes seen and rarely infection at the injection site may occur. All care will be taken to prevent these side effects. If therapy is given over a long time, atrophy and wasting in the muscle injected may occur. Occasionally the patient's become refractory to treatment because they develop antibodies to the toxin. In this event, therapy needs to be modified.  I have read the above information and consent to the administration of botulism toxin.    BOTOX PROCEDURE NOTE FOR MIGRAINE HEADACHE    Contraindications and precautions discussed with patient(above). Aseptic procedure was observed and patient tolerated procedure. Procedure performed by Dr. Artemio Aly  The condition has existed for more than 6 months, and pt does not have a diagnosis of ALS, Myasthenia Gravis or Lambert-Eaton Syndrome.  Risks and benefits of injections discussed and pt agrees to proceed with the procedure.  Written consent obtained  These injections are medically necessary. Pt  receives good benefits from these injections. These injections do not cause sedations or hallucinations which the oral therapies may cause.  Description of procedure:  The patient was placed in a sitting position. The standard protocol was used for Botox as follows, with 5 units of Botox injected at each site:   -Procerus muscle, midline injection  -Corrugator muscle, bilateral injection  -Frontalis muscle, bilateral injection, with 2 sites each side, medial injection was performed in the upper one third of the frontalis muscle, in the region vertical from the medial inferior edge of the superior orbital rim. The lateral injection was again in the upper one third of the forehead vertically above the lateral  limbus of the cornea, 1.5 cm lateral to the medial injection site.  -Temporalis muscle injection, 4 sites, bilaterally. The first injection was 3 cm above the tragus of the ear, second injection site was 1.5 cm to 3 cm up  from the first injection site in line with the tragus of the ear. The third injection site was 1.5-3 cm forward between the first 2 injection sites. The fourth injection site was 1.5 cm posterior to the second injection site.   -Occipitalis muscle injection, 3 sites, bilaterally. The first injection was done one half way between the occipital protuberance and the tip of the mastoid process behind the ear. The second injection site was done lateral and superior to the first, 1 fingerbreadth from the first injection. The third injection site was 1 fingerbreadth superiorly and medially from the first injection site.  -Cervical paraspinal muscle injection, 2 sites, bilateral knee first injection site was 1 cm from the midline of the cervical spine, 3 cm inferior to the lower border of the occipital protuberance. The second injection site was 1.5 cm superiorly and laterally to the first injection site.  -Trapezius muscle injection was performed at 3 sites, bilaterally. The first injection site was in the upper trapezius muscle halfway between the inflection point of the neck, and the acromion. The second injection site was one half way between the acromion and the first injection site. The third injection was done between the first injection site and the inflection point of the neck.   Will return for repeat injection in 3 months.   155 units of Botox was used, 45U Botox not injected was wasted. The patient tolerated the procedure well, there were no complications of the above procedure.

## 2020-11-07 NOTE — Progress Notes (Signed)
Botox- 200 units x 1 vial Lot: [id number] Expiration: 03/2023 NDC: 0023-3921-02  Bacteriostatic 0.9% Sodium Chloride- 4mL total Lot: [id number] Expiration: 02/16/2022 NDC: 0409-1966-02  Dx: G43.711 B/B  

## 2020-11-07 NOTE — Progress Notes (Signed)
Botox- 200 units x 1 vial Lot: [id number] Expiration: 03/2023 NDC: 9122-5834-62  Bacteriostatic 0.9% Sodium Chloride- 69mL total Lot: [id number] Expiration: 08/2021 NDC: 27129-290-90  Dx: B01.499 B/B

## 2020-11-11 NOTE — Telephone Encounter (Signed)
We received a form from [facility] asking for pharmacy benefit insurance information.  This information was faxed to [facility] along with a copy of patient's insurance card. Received a receipt of confirmation.

## 2020-11-21 ENCOUNTER — Other Ambulatory Visit: Payer: Self-pay | Admitting: Neurology

## 2021-01-18 ENCOUNTER — Other Ambulatory Visit: Payer: Self-pay | Admitting: Neurology

## 2021-02-18 ENCOUNTER — Encounter: Payer: Self-pay | Admitting: Adult Health

## 2021-02-20 ENCOUNTER — Ambulatory Visit: Payer: BC Managed Care – PPO | Admitting: Adult Health

## 2021-02-25 ENCOUNTER — Other Ambulatory Visit: Payer: Self-pay

## 2021-02-25 ENCOUNTER — Ambulatory Visit: Payer: BC Managed Care – PPO | Admitting: Adult Health

## 2021-02-25 DIAGNOSIS — G43711 Chronic migraine without aura, intractable, with status migrainosus: Secondary | ICD-10-CM | POA: Diagnosis not present

## 2021-02-25 NOTE — Progress Notes (Signed)
Botox- 200 units x 1 vial °Lot: [id number] °Expiration: 10/2023 °NDC: 0023-3921-02 ° °Bacteriostatic 0.9% Sodium Chloride- 4mL total °Lot: [id number] °Expiration: 09/17/2022 °NDC: 0409-1966-02 ° °Dx: G43.711 °B/B ° °

## 2021-02-25 NOTE — Progress Notes (Signed)
° ° °  02/25/21: reports Botox works well. Minimal headaches with botox.   BOTOX PROCEDURE NOTE FOR MIGRAINE HEADACHE    Contraindications and precautions discussed with patient(above). Aseptic procedure was observed and patient tolerated procedure. Procedure performed by Butch Penny, NP  The condition has existed for more than 6 months, and pt does not have a diagnosis of ALS, Myasthenia Gravis or Lambert-Eaton Syndrome.  Risks and benefits of injections discussed and pt agrees to proceed with the procedure.  Written consent obtained  These injections are medically necessary. These injections do not cause sedations or hallucinations which the oral therapies may cause.  Indication/Diagnosis: chronic migraine BOTOX(J0585) injection was performed according to protocol by [facility]. 200 units of BOTOX was dissolved into 4 cc NS.   NDC: [address]-01  Type of toxin: Botox  Botox- 200 units x 1 vial Lot: [id number] Expiration: 10/2023 NDC: 5916-3846-65   Bacteriostatic 0.9% Sodium Chloride- 28mL total Lot: [id number] Expiration: 09/17/2022 NDC: 1779-3903-00   Dx: P23.300   Description of procedure:  The patient was placed in a sitting position. The standard protocol was used for Botox as follows, with 5 units of Botox injected at each site:   -Procerus muscle, midline injection  -Corrugator muscle, bilateral injection  -Frontalis muscle, bilateral injection, with 2 sites each side, medial injection was performed in the upper one third of the frontalis muscle, in the region vertical from the medial inferior edge of the superior orbital rim. The lateral injection was again in the upper one third of the forehead vertically above the lateral limbus of the cornea, 1.5 cm lateral to the medial injection site.  -Temporalis muscle injection, 4 sites, bilaterally. The first injection was 3 cm above the tragus of the ear, second injection site was 1.5 cm to 3 cm up from the first injection site  in line with the tragus of the ear. The third injection site was 1.5-3 cm forward between the first 2 injection sites. The fourth injection site was 1.5 cm posterior to the second injection site.  -Occipitalis muscle injection, 3 sites, bilaterally. The first injection was done one half way between the occipital protuberance and the tip of the mastoid process behind the ear. The second injection site was done lateral and superior to the first, 1 fingerbreadth from the first injection. The third injection site was 1 fingerbreadth superiorly and medially from the first injection site.  -Cervical paraspinal muscle injection, 2 sites, bilateral knee first injection site was 1 cm from the midline of the cervical spine, 3 cm inferior to the lower border of the occipital protuberance. The second injection site was 1.5 cm superiorly and laterally to the first injection site.  -Trapezius muscle injection was performed at 3 sites, bilaterally. The first injection site was in the upper trapezius muscle halfway between the inflection point of the neck, and the acromion. The second injection site was one half way between the acromion and the first injection site. The third injection was done between the first injection site and the inflection point of the neck.   Will return for repeat injection in 3 months.   A 200 unit sof Botox was used, 155 units were injected, the rest of the Botox was wasted. The patient tolerated the procedure well, there were no complications of the above procedure.  Butch Penny, MSN, NP-C 02/25/2021, 10:10 AM [facility] [address] [place], [place] [address] [phone]

## 2021-02-28 ENCOUNTER — Other Ambulatory Visit: Payer: Self-pay | Admitting: Neurology

## 2021-04-19 ENCOUNTER — Other Ambulatory Visit: Payer: Self-pay | Admitting: Neurology

## 2021-05-05 ENCOUNTER — Telehealth: Payer: Self-pay | Admitting: Adult Health

## 2021-05-05 NOTE — Telephone Encounter (Signed)
Completed [employer] Botox continuation form, placed in Nurse Pod for Md signature. ?

## 2021-05-15 NOTE — Telephone Encounter (Signed)
Called [employer] spoke with Robynn Pane to initiate PA. PA is pending for review, faxing over clinical notes to [phone]. Pending auth number # [id number]. ?

## 2021-05-19 NOTE — Telephone Encounter (Signed)
Received approval from [facility] PA  # [id number] (05/15/2021-05/16/2022). ?

## 2021-05-20 ENCOUNTER — Encounter: Payer: Self-pay | Admitting: Adult Health

## 2021-05-20 ENCOUNTER — Ambulatory Visit: Payer: BC Managed Care – PPO | Admitting: Adult Health

## 2021-05-20 DIAGNOSIS — G43711 Chronic migraine without aura, intractable, with status migrainosus: Secondary | ICD-10-CM | POA: Diagnosis not present

## 2021-05-20 NOTE — Progress Notes (Signed)
Botox- 200 units x 1 vial ?Lot: [id number] ?Expiration: 10/2023 ?NDC: 0023-3921-02 ? ?Bacteriostatic 0.9% Sodium Chloride- 4mL total ?Lot: [id number] ?Expiration: 09/17/2022 ?NDC: 0409-1966-02 ? ?Dx: G43.711 ?B/B ? ?

## 2021-05-20 NOTE — Progress Notes (Signed)
? ? ?  02/25/21: reports Botox works well. Minimal headaches with botox. ? ? ?BOTOX PROCEDURE NOTE FOR MIGRAINE HEADACHE ? ? ? ?Contraindications and precautions discussed with patient(above). Aseptic procedure was observed and patient tolerated procedure. Procedure performed by Butch Penny, NP ? ?The condition has existed for more than 6 months, and pt does not have a diagnosis of ALS, Myasthenia Gravis or Lambert-Eaton Syndrome.  Risks and benefits of injections discussed and pt agrees to proceed with the procedure.  Written consent obtained ? ?These injections are medically necessary. These injections do not cause sedations or hallucinations which the oral therapies may cause. ? ?Indication/Diagnosis: chronic migraine ?IFOYD(X4128) injection was performed according to protocol by [facility]. 200 units of BOTOX was dissolved into 4 cc NS.   ?NDC: [phone] ? ?Type of toxin: Botox ? ?Botox- 200 units x 1 vial ?Lot: [id number] ?Expiration: 10/2023 ?NDC: [phone] ?  ?Bacteriostatic 0.9% Sodium Chloride- 68mL total ?Lot: [id number] ?Expiration: 09/17/2022 ?NDC: 8127-5170-01 ?  ?Dx: V49.449 ? ?Description of procedure: ? ?The patient was placed in a sitting position. The standard protocol was used for Botox as follows, with 5 units of Botox injected at each site: ? ? ?-Procerus muscle, midline injection ? ?-Corrugator muscle, bilateral injection ? ?-Frontalis muscle, bilateral injection, with 2 sites each side, medial injection was performed in the upper one third of the frontalis muscle, in the region vertical from the medial inferior edge of the superior orbital rim. The lateral injection was again in the upper one third of the forehead vertically above the lateral limbus of the cornea, 1.5 cm lateral to the medial injection site. ? ?-Temporalis muscle injection, 4 sites, bilaterally. The first injection was 3 cm above the tragus of the ear, second injection site was 1.5 cm to 3 cm up from the first injection site in  line with the tragus of the ear. The third injection site was 1.5-3 cm forward between the first 2 injection sites. The fourth injection site was 1.5 cm posterior to the second injection site. ? ?-Occipitalis muscle injection, 3 sites, bilaterally. The first injection was done one half way between the occipital protuberance and the tip of the mastoid process behind the ear. The second injection site was done lateral and superior to the first, 1 fingerbreadth from the first injection. The third injection site was 1 fingerbreadth superiorly and medially from the first injection site. ? ?-Cervical paraspinal muscle injection, 2 sites, bilateral knee first injection site was 1 cm from the midline of the cervical spine, 3 cm inferior to the lower border of the occipital protuberance. The second injection site was 1.5 cm superiorly and laterally to the first injection site. ? ?-Trapezius muscle injection was performed at 3 sites, bilaterally. The first injection site was in the upper trapezius muscle halfway between the inflection point of the neck, and the acromion. The second injection site was one half way between the acromion and the first injection site. The third injection was done between the first injection site and the inflection point of the neck. ? ? ?Will return for repeat injection in 3 months. ? ? ?A 200 unit sof Botox was used, 155 units were injected, the rest of the Botox was wasted. The patient tolerated the procedure well, there were no complications of the above procedure. ? ?Butch Penny, MSN, NP-C 05/20/2021, 11:13 AM ?[facility] ?[address], [address] ?[place], [place] [address] ?[phone] ? ?

## 2021-06-19 NOTE — Telephone Encounter (Signed)
Faxed signed PA form with OV notes to [employer]. ?

## 2021-07-07 ENCOUNTER — Other Ambulatory Visit: Payer: Self-pay | Admitting: Neurology

## 2021-08-14 ENCOUNTER — Ambulatory Visit: Payer: BC Managed Care – PPO | Admitting: Adult Health

## 2021-08-14 VITALS — BP 113/70 | HR 65

## 2021-08-14 DIAGNOSIS — G43711 Chronic migraine without aura, intractable, with status migrainosus: Secondary | ICD-10-CM

## 2021-08-14 NOTE — Progress Notes (Deleted)
       BOTOX PROCEDURE NOTE FOR MIGRAINE HEADACHE    Contraindications and precautions discussed with patient(above). Aseptic procedure was observed and patient tolerated procedure. Procedure performed by Butch Penny, NP  The condition has existed for more than 6 months, and pt does not have a diagnosis of ALS, Myasthenia Gravis or Lambert-Eaton Syndrome.  Risks and benefits of injections discussed and pt agrees to proceed with the procedure.  Written consent obtained  These injections are medically necessary. He receives good benefits from these injections***. These injections do not cause sedations or hallucinations which the oral therapies may cause.  Indication/Diagnosis: chronic migraine BOTOX(J0585) injection was performed according to protocol by [employer]. 200 units of BOTOX was dissolved into 4 cc NS.   NDC: [address]-43  Type of toxin: Botox  Lot # *** EXP: ***   Description of procedure:  The patient was placed in a sitting position. The standard protocol was used for Botox as follows, with 5 units of Botox injected at each site:   -Procerus muscle, midline injection  -Corrugator muscle, bilateral injection  -Frontalis muscle, bilateral injection, with 2 sites each side, medial injection was performed in the upper one third of the frontalis muscle, in the region vertical from the medial inferior edge of the superior orbital rim. The lateral injection was again in the upper one third of the forehead vertically above the lateral limbus of the cornea, 1.5 cm lateral to the medial injection site.  -Temporalis muscle injection, 4 sites, bilaterally. The first injection was 3 cm above the tragus of the ear, second injection site was 1.5 cm to 3 cm up from the first injection site in line with the tragus of the ear. The third injection site was 1.5-3 cm forward between the first 2 injection sites. The fourth injection site was 1.5 cm posterior to the second injection  site.  -Occipitalis muscle injection, 3 sites, bilaterally. The first injection was done one half way between the occipital protuberance and the tip of the mastoid process behind the ear. The second injection site was done lateral and superior to the first, 1 fingerbreadth from the first injection. The third injection site was 1 fingerbreadth superiorly and medially from the first injection site.  -Cervical paraspinal muscle injection, 2 sites, bilateral knee first injection site was 1 cm from the midline of the cervical spine, 3 cm inferior to the lower border of the occipital protuberance. The second injection site was 1.5 cm superiorly and laterally to the first injection site.  -Trapezius muscle injection was performed at 3 sites, bilaterally. The first injection site was in the upper trapezius muscle halfway between the inflection point of the neck, and the acromion. The second injection site was one half way between the acromion and the first injection site. The third injection was done between the first injection site and the inflection point of the neck.   Will return for repeat injection in 3 months.   A 200 unit sof Botox was used, 155 units were injected, the rest of the Botox was wasted. The patient tolerated the procedure well, there were no complications of the above procedure.  Butch Penny, MSN, NP-C 08/14/2021, 12:06 PM [facility] [address], [address] [place], [place] [address] [phone]

## 2021-08-14 NOTE — Progress Notes (Signed)
   08/14/2021: Botox continue to work well.  02/25/21: reports Botox works well. Minimal headaches with botox.   BOTOX PROCEDURE NOTE FOR MIGRAINE HEADACHE    Contraindications and precautions discussed with patient(above). Aseptic procedure was observed and patient tolerated procedure. Procedure performed by Butch Penny, NP  The condition has existed for more than 6 months, and pt does not have a diagnosis of ALS, Myasthenia Gravis or Lambert-Eaton Syndrome.  Risks and benefits of injections discussed and pt agrees to proceed with the procedure.  Written consent obtained  These injections are medically necessary. These injections do not cause sedations or hallucinations which the oral therapies may cause.  Indication/Diagnosis: chronic migraine BOTOX(J0585) injection was performed according to protocol by [employer]. 200 units of BOTOX was dissolved into 4 cc NS.   NDC: [address]-17  Type of toxin: Botox  Botox- 200 units x 1 vial Lot: [id number] Expiration: 2026/02 NDC: 0023-3921-02   Bacteriostatic 0.9% Sodium Chloride- 2mL total Lot: [id number] Expiration: 18 Oct 2022 NDC: 4665-9935-70   Dx: V77.939   Description of procedure:  The patient was placed in a sitting position. The standard protocol was used for Botox as follows, with 5 units of Botox injected at each site:   -Procerus muscle, midline injection  -Corrugator muscle, bilateral injection  -Frontalis muscle, bilateral injection, with 2 sites each side, medial injection was performed in the upper one third of the frontalis muscle, in the region vertical from the medial inferior edge of the superior orbital rim. The lateral injection was again in the upper one third of the forehead vertically above the lateral limbus of the cornea, 1.5 cm lateral to the medial injection site.  -Temporalis muscle injection, 4 sites, bilaterally. The first injection was 3 cm above the tragus of the ear, second injection site was 1.5 cm  to 3 cm up from the first injection site in line with the tragus of the ear. The third injection site was 1.5-3 cm forward between the first 2 injection sites. The fourth injection site was 1.5 cm posterior to the second injection site.  -Occipitalis muscle injection, 3 sites, bilaterally. The first injection was done one half way between the occipital protuberance and the tip of the mastoid process behind the ear. The second injection site was done lateral and superior to the first, 1 fingerbreadth from the first injection. The third injection site was 1 fingerbreadth superiorly and medially from the first injection site.  -Cervical paraspinal muscle injection, 2 sites, bilateral knee first injection site was 1 cm from the midline of the cervical spine, 3 cm inferior to the lower border of the occipital protuberance. The second injection site was 1.5 cm superiorly and laterally to the first injection site.  -Trapezius muscle injection was performed at 3 sites, bilaterally. The first injection site was in the upper trapezius muscle halfway between the inflection point of the neck, and the acromion. The second injection site was one half way between the acromion and the first injection site. The third injection was done between the first injection site and the inflection point of the neck.   Will return for repeat injection in 3 months.   A 200 units of Botox was used, 155 units were injected, the rest of the Botox was wasted. The patient tolerated the procedure well, there were no complications of the above procedure.  Butch Penny, MSN, NP-C 08/14/2021, 1:19 PM [facility] [address] [place], [place] [address] [phone]

## 2021-08-14 NOTE — Progress Notes (Signed)
  Botox- 200 units x 1 vial Lot: [id number] Expiration: 2026/02 NDC: 0023-3921-02  Bacteriostatic 0.9% Sodium Chloride- 3mL total Lot: [id number] Expiration: 18 Oct 2022 NDC: 6967-8938-10  Dx: G43.711  B/B

## 2021-09-24 ENCOUNTER — Other Ambulatory Visit: Payer: Self-pay | Admitting: Neurology

## 2021-10-24 ENCOUNTER — Other Ambulatory Visit: Payer: Self-pay | Admitting: Neurology

## 2021-11-17 ENCOUNTER — Ambulatory Visit: Payer: BC Managed Care – PPO | Admitting: Adult Health

## 2021-11-17 DIAGNOSIS — G43711 Chronic migraine without aura, intractable, with status migrainosus: Secondary | ICD-10-CM | POA: Diagnosis not present

## 2021-11-17 MED ORDER — ONABOTULINUMTOXINA 200 UNITS IJ SOLR
155.0000 [IU] | Freq: Once | INTRAMUSCULAR | Status: AC
  Administered 2021-11-17: 155 [IU] via INTRAMUSCULAR

## 2021-11-17 NOTE — Progress Notes (Signed)
11/17/2021: Botox is working well but did have several breathrough headaches when botox was due. Uses maxalt or nurtec when she gets a headache. 08/14/2021: Botox continue to work well.  02/25/21: reports Botox works well. Minimal headaches with botox.   BOTOX PROCEDURE NOTE FOR MIGRAINE HEADACHE    Contraindications and precautions discussed with patient(above). Aseptic procedure was observed and patient tolerated procedure. Procedure performed by Butch Penny, NP  The condition has existed for more than 6 months, and pt does not have a diagnosis of ALS, Myasthenia Gravis or Lambert-Eaton Syndrome.  Risks and benefits of injections discussed and pt agrees to proceed with the procedure.  Written consent obtained  These injections are medically necessary. These injections do not cause sedations or hallucinations which the oral therapies may cause.  Indication/Diagnosis: chronic migraine BOTOX(J0585) injection was performed according to protocol by [employer]. 200 units of BOTOX was dissolved into 4 cc NS.   NDC: [address]-53  Type of toxin: Botox  Botox- 200 units x 1 vial Lot: [id number] Expiration: 03/2024 NDC: 9379-0240-97   Bacteriostatic 0.9% Sodium Chloride- 26mL total Lot: [id number] Expiration: 09/2022 NDC: 3532-9924-26   Dx: S34.196 B/B   Description of procedure:  The patient was placed in a sitting position. The standard protocol was used for Botox as follows, with 5 units of Botox injected at each site:   -Procerus muscle, midline injection  -Corrugator muscle, bilateral injection  -Frontalis muscle, bilateral injection, with 2 sites each side, medial injection was performed in the upper one third of the frontalis muscle, in the region vertical from the medial inferior edge of the superior orbital rim. The lateral injection was again in the upper one third of the forehead vertically above the lateral limbus of the cornea, 1.5 cm lateral to the medial injection  site.  -Temporalis muscle injection, 4 sites, bilaterally. The first injection was 3 cm above the tragus of the ear, second injection site was 1.5 cm to 3 cm up from the first injection site in line with the tragus of the ear. The third injection site was 1.5-3 cm forward between the first 2 injection sites. The fourth injection site was 1.5 cm posterior to the second injection site.  -Occipitalis muscle injection, 3 sites, bilaterally. The first injection was done one half way between the occipital protuberance and the tip of the mastoid process behind the ear. The second injection site was done lateral and superior to the first, 1 fingerbreadth from the first injection. The third injection site was 1 fingerbreadth superiorly and medially from the first injection site.  -Cervical paraspinal muscle injection, 2 sites, bilateral knee first injection site was 1 cm from the midline of the cervical spine, 3 cm inferior to the lower border of the occipital protuberance. The second injection site was 1.5 cm superiorly and laterally to the first injection site.  -Trapezius muscle injection was performed at 3 sites, bilaterally. The first injection site was in the upper trapezius muscle halfway between the inflection point of the neck, and the acromion. The second injection site was one half way between the acromion and the first injection site. The third injection was done between the first injection site and the inflection point of the neck.   Will return for repeat injection in 3 months.   A 200 units of Botox was used, 155 units were injected, the rest of the Botox was wasted. The patient tolerated the procedure well, there were no complications of the above procedure.  Butch Penny,  MSN, NP-C 11/17/2021, 11:35 AM [facility] [address] [place], [place] [address] [phone]

## 2021-11-17 NOTE — Progress Notes (Signed)
Botox- 200 units x 1 vial Lot: [id number] Expiration: 03/2024 NDC: 5427-0623-76  Bacteriostatic 0.9% Sodium Chloride- 22mL total Lot: [id number] Expiration: 09/2022 NDC: 2831-5176-16  Dx: W73.710 B/B

## 2021-11-23 ENCOUNTER — Other Ambulatory Visit: Payer: Self-pay | Admitting: Neurology

## 2021-12-13 ENCOUNTER — Other Ambulatory Visit: Payer: Self-pay | Admitting: Neurology

## 2022-01-10 ENCOUNTER — Other Ambulatory Visit: Payer: Self-pay | Admitting: Neurology

## 2022-02-16 ENCOUNTER — Other Ambulatory Visit: Payer: Self-pay | Admitting: Adult Health

## 2022-02-21 ENCOUNTER — Encounter: Payer: Self-pay | Admitting: Adult Health

## 2022-02-23 ENCOUNTER — Ambulatory Visit: Payer: BC Managed Care – PPO | Admitting: Adult Health

## 2022-02-23 ENCOUNTER — Telehealth: Payer: Self-pay | Admitting: Adult Health

## 2022-02-23 NOTE — Telephone Encounter (Signed)
Called pt to r/s her BOTOX appt. No Show fee does not appy.

## 2022-02-23 NOTE — Telephone Encounter (Signed)
Pt cancelled appt due to have had back surgery and unable to come to appt.

## 2022-03-17 NOTE — Telephone Encounter (Signed)
Spoke to patient this am . Pt states insurance the same this year [employer]  . Insurance ID # is the same

## 2022-03-18 ENCOUNTER — Ambulatory Visit: Payer: BC Managed Care – PPO | Admitting: Adult Health

## 2022-03-18 ENCOUNTER — Other Ambulatory Visit: Payer: Self-pay | Admitting: Neurology

## 2022-03-18 DIAGNOSIS — G43711 Chronic migraine without aura, intractable, with status migrainosus: Secondary | ICD-10-CM | POA: Diagnosis not present

## 2022-03-18 MED ORDER — ONABOTULINUMTOXINA 200 UNITS IJ SOLR
155.0000 [IU] | Freq: Once | INTRAMUSCULAR | Status: AC
  Administered 2022-03-18: 155 [IU] via INTRAMUSCULAR

## 2022-03-18 NOTE — Progress Notes (Signed)
03/18/22: botox continues to work well. Uses maxalt or nurtec for abortive therapy. 11/17/2021: Botox is working well but did have several breathrough headaches when botox was due. Uses maxalt or nurtec when she gets a headache. 08/14/2021: Botox continue to work well.  02/25/21: reports Botox works well. Minimal headaches with botox.   BOTOX PROCEDURE NOTE FOR MIGRAINE HEADACHE    Contraindications and precautions discussed with patient(above). Aseptic procedure was observed and patient tolerated procedure. Procedure performed by Ward Givens, NP  The condition has existed for more than 6 months, and pt does not have a diagnosis of ALS, Myasthenia Gravis or Lambert-Eaton Syndrome.  Risks and benefits of injections discussed and pt agrees to proceed with the procedure.  Written consent obtained  These injections are medically necessary. These injections do not cause sedations or hallucinations which the oral therapies may cause.  Indication/Diagnosis: chronic migraine BOTOX(J0585) injection was performed according to protocol by [employer]. 200 units of BOTOX was dissolved into 4 cc NS.   NDC: [address]-42  Type of toxin: Botox  Botox- 200 units x 1 vial Lot: [id number] Expiration: 07/2024 NDC: 5643-3295-18   Bacteriostatic 0.9% Sodium Chloride- 69mL total Lot: [id number] Expiration: 11/25 NDC: 30160-109-32   Dx: T55.73   Description of procedure:  The patient was placed in a sitting position. The standard protocol was used for Botox as follows, with 5 units of Botox injected at each site:   -Procerus muscle, midline injection  -Corrugator muscle, bilateral injection  -Frontalis muscle, bilateral injection, with 2 sites each side, medial injection was performed in the upper one third of the frontalis muscle, in the region vertical from the medial inferior edge of the superior orbital rim. The lateral injection was again in the upper one third of the forehead vertically above  the lateral limbus of the cornea, 1.5 cm lateral to the medial injection site.  -Temporalis muscle injection, 4 sites, bilaterally. The first injection was 3 cm above the tragus of the ear, second injection site was 1.5 cm to 3 cm up from the first injection site in line with the tragus of the ear. The third injection site was 1.5-3 cm forward between the first 2 injection sites. The fourth injection site was 1.5 cm posterior to the second injection site.  -Occipitalis muscle injection, 3 sites, bilaterally. The first injection was done one half way between the occipital protuberance and the tip of the mastoid process behind the ear. The second injection site was done lateral and superior to the first, 1 fingerbreadth from the first injection. The third injection site was 1 fingerbreadth superiorly and medially from the first injection site.  -Cervical paraspinal muscle injection, 2 sites, bilateral knee first injection site was 1 cm from the midline of the cervical spine, 3 cm inferior to the lower border of the occipital protuberance. The second injection site was 1.5 cm superiorly and laterally to the first injection site.  -Trapezius muscle injection was performed at 3 sites, bilaterally. The first injection site was in the upper trapezius muscle halfway between the inflection point of the neck, and the acromion. The second injection site was one half way between the acromion and the first injection site. The third injection was done between the first injection site and the inflection point of the neck.   Will return for repeat injection in 3 months.   A 200 units of Botox was used, 155 units were injected, the rest of the Botox was wasted. The patient tolerated the procedure  well, there were no complications of the above procedure.  Ward Givens, MSN, NP-C 03/18/2022, 9:40 AM [facility] [address] [place], [place] [address] [phone]

## 2022-03-18 NOTE — Progress Notes (Signed)
Botox- 200 units x 1 vial Lot: [id number] Expiration: 07/2024 NDC: 0023-3921-02  Bacteriostatic 0.9% Sodium Chloride- 4mL total Lot: [id number] Expiration: 11/25 NDC: [address]-03  Dx: G43.711  B/B 

## 2022-04-15 ENCOUNTER — Other Ambulatory Visit: Payer: Self-pay | Admitting: Adult Health

## 2022-05-27 ENCOUNTER — Ambulatory Visit: Payer: BC Managed Care – PPO | Admitting: Adult Health

## 2022-05-31 ENCOUNTER — Other Ambulatory Visit: Payer: Self-pay | Admitting: Adult Health

## 2022-06-08 NOTE — Telephone Encounter (Signed)
Pt has been called and scheduled for a f/u for future refills.

## 2022-06-11 ENCOUNTER — Ambulatory Visit: Payer: BC Managed Care – PPO | Admitting: Adult Health

## 2022-06-15 ENCOUNTER — Other Ambulatory Visit: Payer: Self-pay | Admitting: Adult Health

## 2022-06-16 IMAGING — DX DG KNEE COMPLETE 4+V*L*
4 series · 4 of 4 positions shown · non-contrast
Comparison: None.

CLINICAL DATA: Left knee pain after fall

EXAM:
LEFT KNEE - COMPLETE 4+ VIEW

[knee ap]
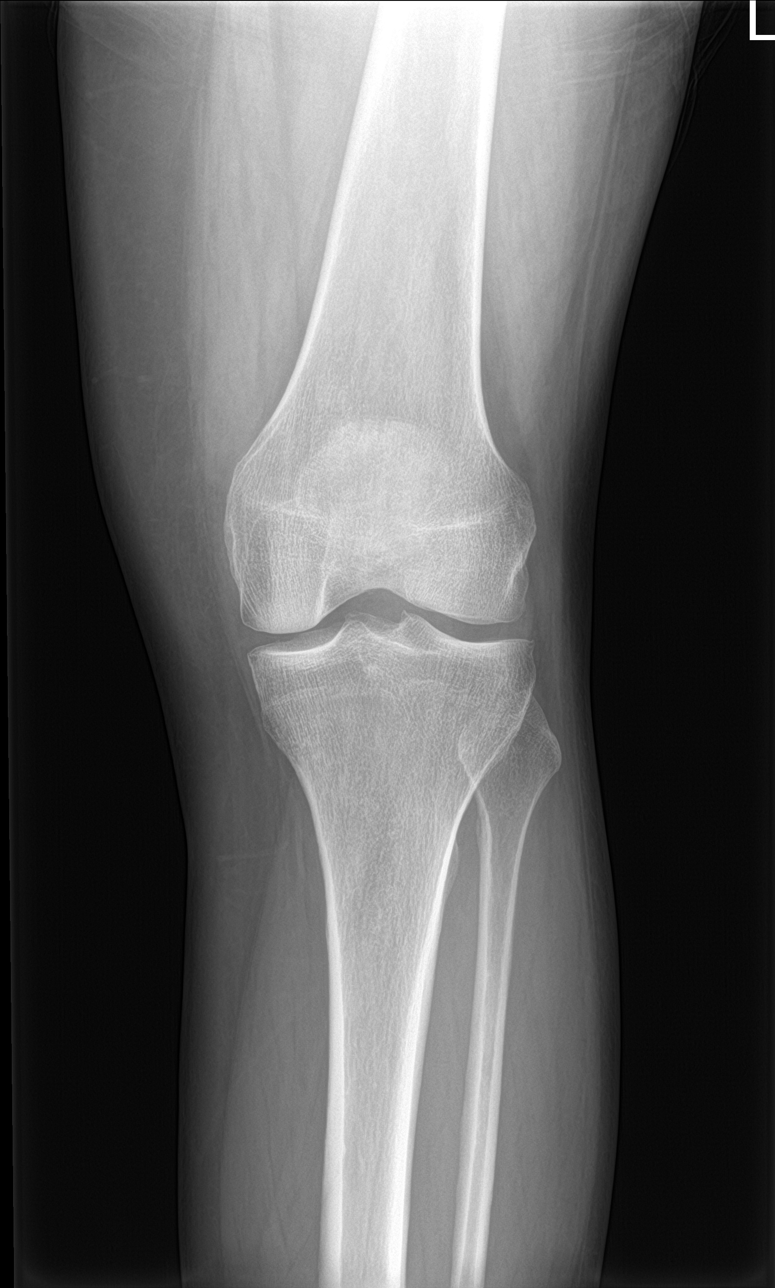

[knee lat]
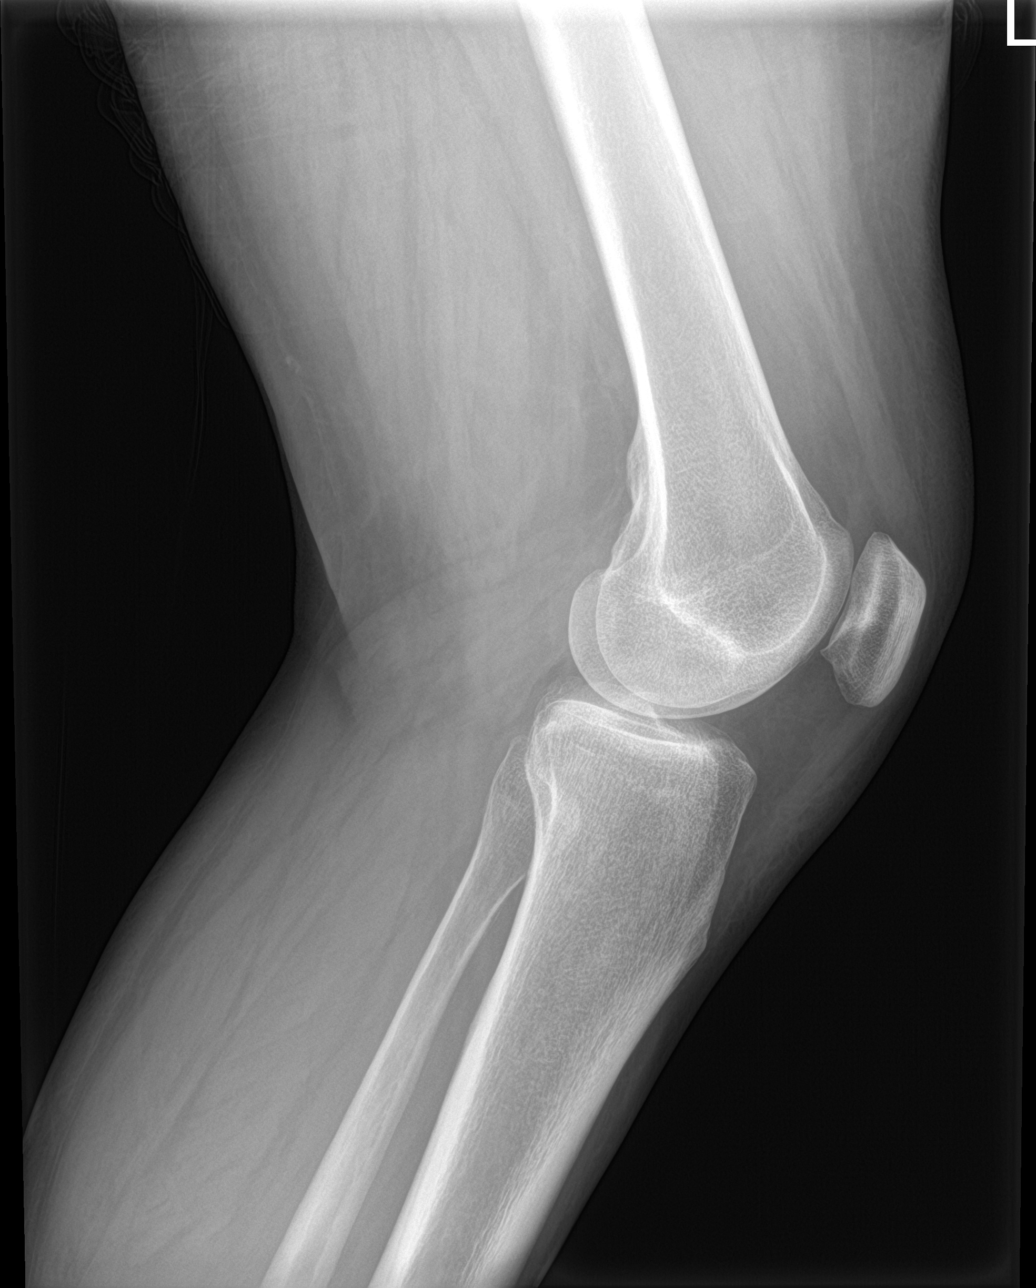

[knee obl (1 of 2)]
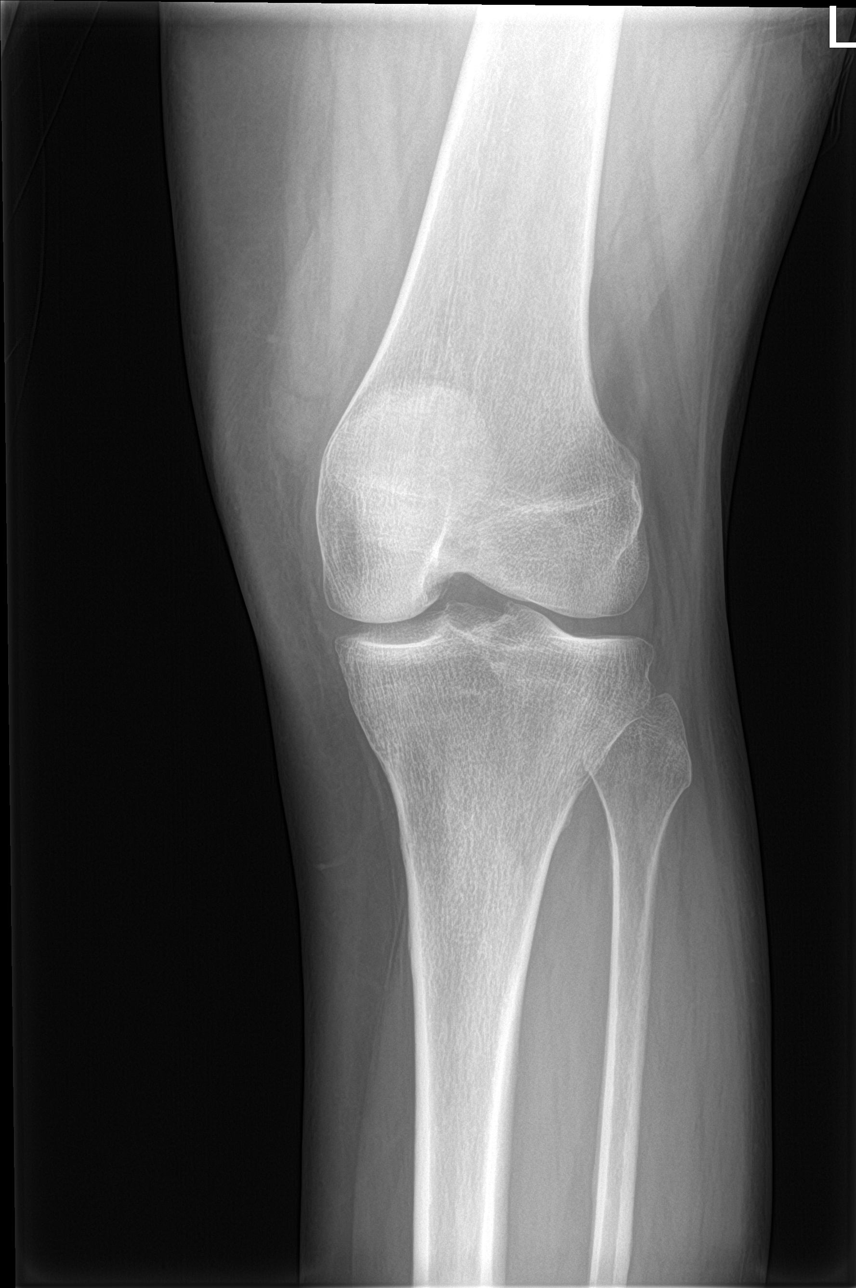

[knee obl (2 of 2)]
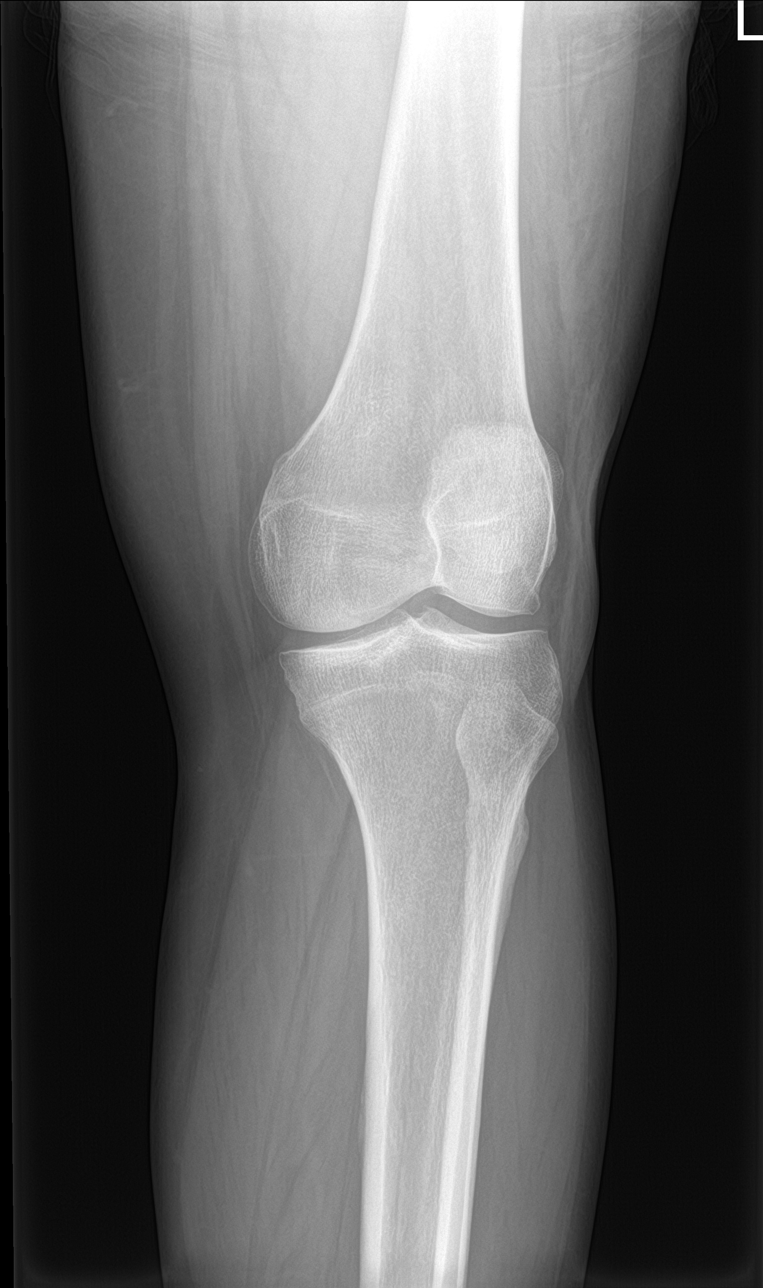

[4 of 4 positions shown; findings below may reference images not displayed]

FINDINGS: No evidence of fracture, dislocation, or joint effusion. No evidence
of arthropathy or other focal bone abnormality. Soft tissues are
unremarkable.
IMPRESSION: Negative.

## 2022-06-16 IMAGING — DX DG ANKLE COMPLETE 3+V*L*
3 series · 3 of 3 positions shown · non-contrast
Comparison: None.

CLINICAL DATA: Lateral left foot and ankle pain and swelling after
fall

EXAM:
LEFT ANKLE COMPLETE - 3+ VIEW; LEFT FOOT - COMPLETE 3+ VIEW

[ankle ap]
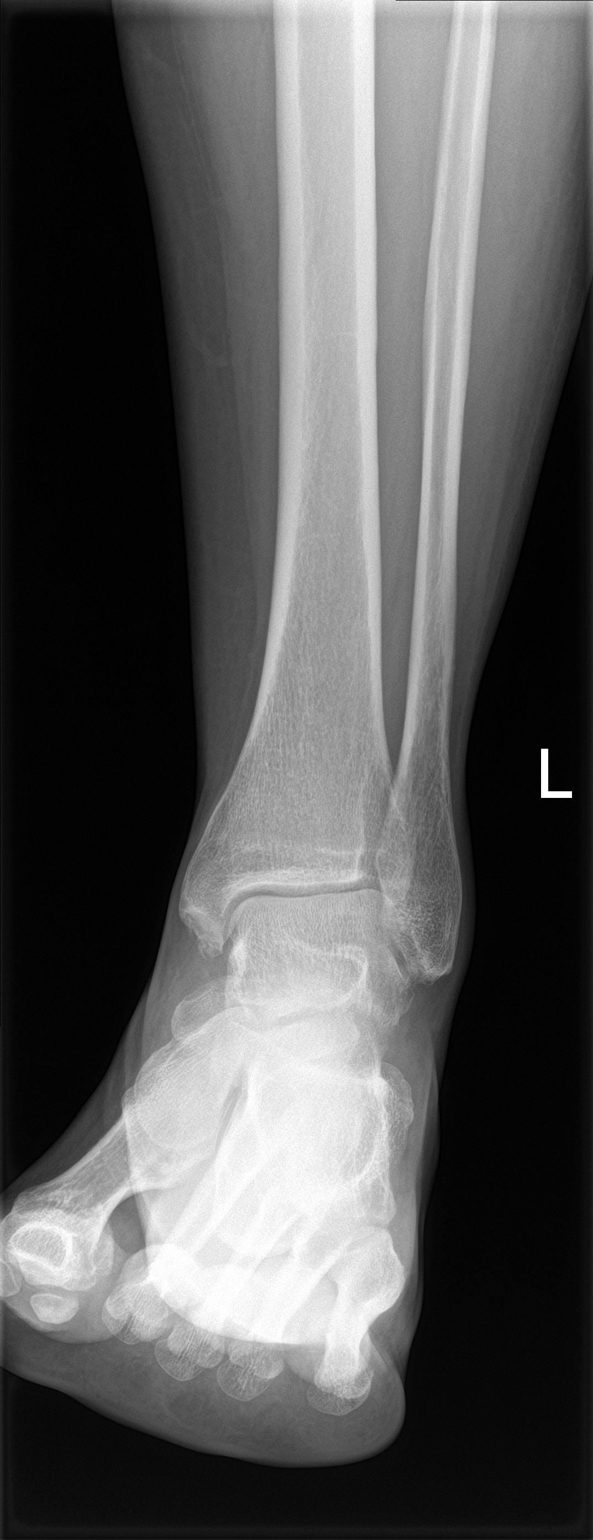

[ankle obl]
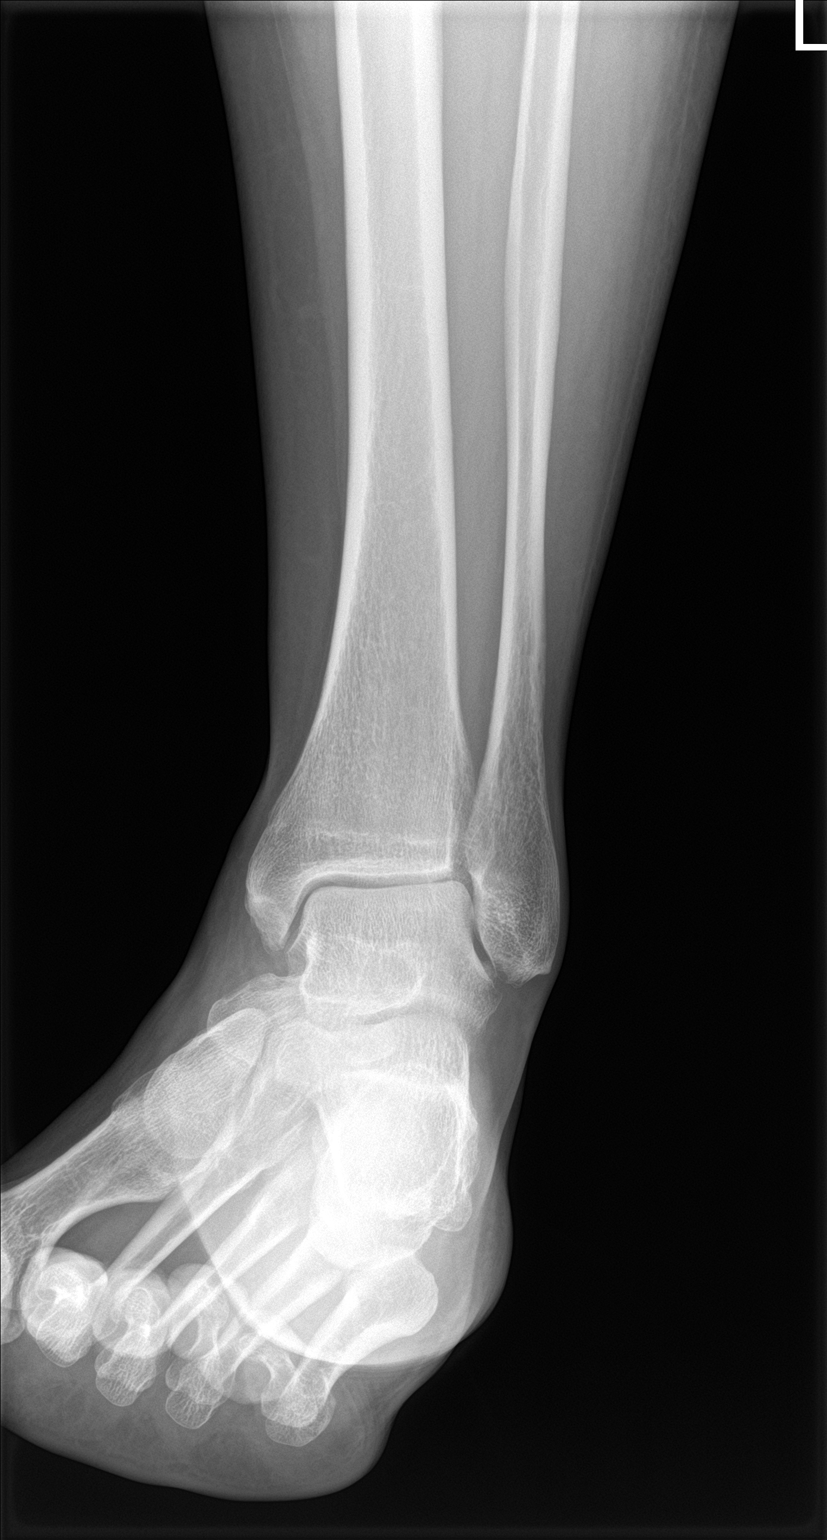

[ankle lat]
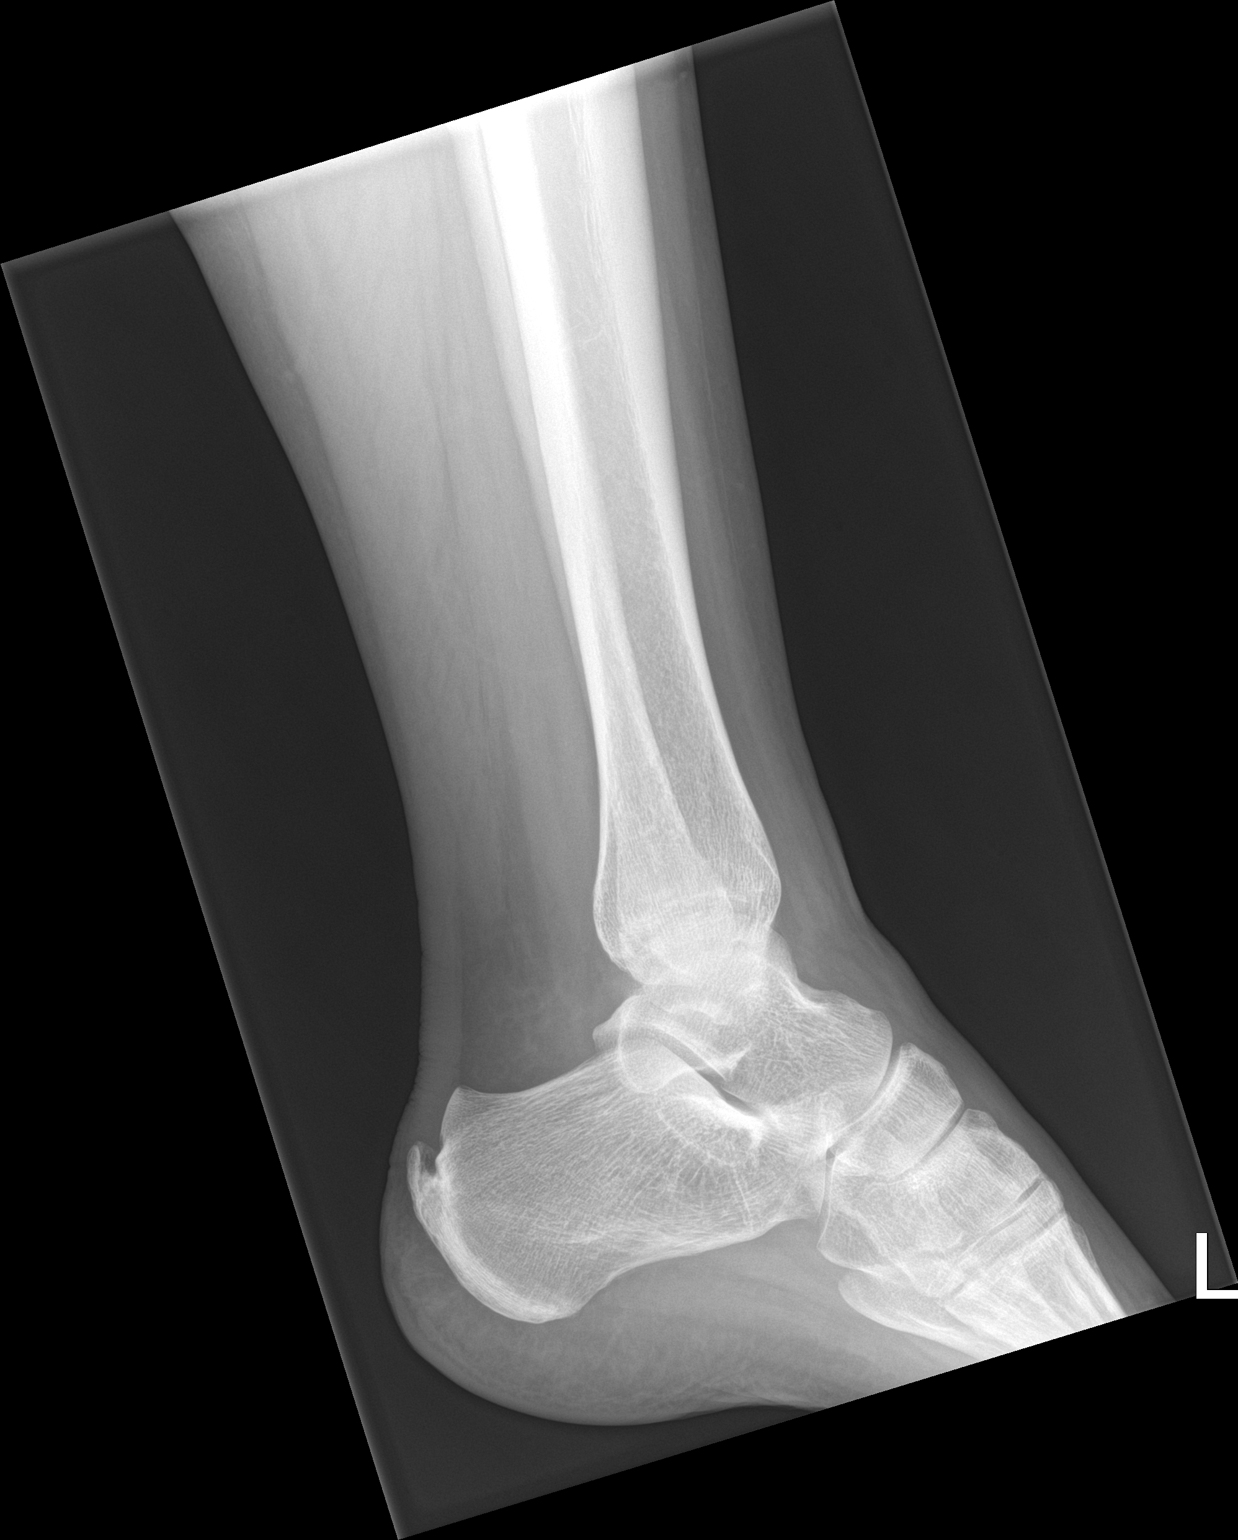

[3 of 3 positions shown; findings below may reference images not displayed]

FINDINGS: No evidence of acute fracture. No malalignment. Chronic appearing
irregularity along the inferior tip of the medial malleolus, likely
reflecting sequela of remote trauma. Enthesophyte present at the
Achilles tendon insertion. Joint spaces are maintained. No soft
tissue swelling.
IMPRESSION: No acute osseous abnormality, left foot or ankle.

## 2022-06-18 NOTE — Telephone Encounter (Signed)
Pt has appt on 06-22-2022 for medication management.

## 2022-06-22 ENCOUNTER — Encounter: Payer: Self-pay | Admitting: Adult Health

## 2022-06-22 ENCOUNTER — Ambulatory Visit: Payer: BC Managed Care – PPO | Admitting: Adult Health

## 2022-06-22 VITALS — BP 111/76 | HR 60 | Ht 68.0 in | Wt 206.2 lb

## 2022-06-22 DIAGNOSIS — G43719 Chronic migraine without aura, intractable, without status migrainosus: Secondary | ICD-10-CM

## 2022-06-22 MED ORDER — FLUOXETINE HCL 40 MG PO CAPS: ORAL_CAPSULE | ORAL | 3 refills | Status: DC

## 2022-06-22 MED ORDER — QULIPTA 30 MG PO TABS: 30.0000 mg | ORAL_TABLET | Freq: Every day | ORAL | 11 refills | Status: DC

## 2022-06-22 NOTE — Progress Notes (Signed)
PATIENT: Ann Bailey DOB: 1970-04-07  REASON FOR VISIT: follow up HISTORY FROM: patient PRIMARY NEUROLOGIST: Dr. Lucia Gaskins   Chief Complaint  Patient presents with   Follow-up    Rm 8, alone.  Worsening migraines since 2 wks.  Working on botox issue.  Not taking ajovy.      HISTORY OF PRESENT ILLNESS: Today 06/22/22  Ann Bailey is a 52 y.o. female who has been followed in this office for Migraines. Returns today for follow-up.  Last couple of weeks migraines have increased to 2-3 migraines a week. Weather is a trigger. No longer taking Ajovy because botox is working well but currently unable to get Botox due to cost. Able to take Maxalt with good benefit but will also use Nurtec.    REVIEW OF SYSTEMS: Out of a complete 14 system review of symptoms, the patient complains only of the following symptoms, and all other reviewed systems are negative.  ALLERGIES: Allergies  Allergen Reactions   Latex Rash    HOME MEDICATIONS: Outpatient Medications Prior to Visit  Medication Sig Dispense Refill   atorvastatin (LIPITOR) 40 MG tablet Take by mouth.     Empagliflozin (JARDIANCE PO) Take by mouth.     FLUoxetine (PROZAC) 40 MG capsule TAKE 1 CAPSULE BY MOUTH EVERY DAY 90 capsule 0   HYDROcodone-acetaminophen (NORCO/VICODIN) 5-325 MG tablet Take by mouth.     ibuprofen (ADVIL,MOTRIN) 800 MG tablet prn     levonorgestrel (MIRENA) 20 MCG/24HR IUD by Intrauterine route.     levothyroxine (SYNTHROID) 175 MCG tablet Take 125 mcg by mouth daily before breakfast.     meclizine (ANTIVERT) 12.5 MG tablet Take by mouth 2 (two) times daily as needed.      ondansetron (ZOFRAN) 8 MG tablet Take 1 tablet (8 mg total) by mouth every 8 (eight) hours as needed for nausea or vomiting. 30 tablet 6   pioglitazone (ACTOS) 30 MG tablet Take by mouth.     propranolol (INDERAL) 20 MG tablet TAKE 1 TABLET BY MOUTH TWICE A DAY 180 tablet 0   Rimegepant Sulfate (NURTEC) 75 MG TBDP Take 75 mg by mouth  daily as needed. For migraines. Take as close to onset of migraine as possible. One daily maximum. 10 tablet 6   rizatriptan (MAXALT) 10 MG tablet TAKE 1 TABLET BY MOUTH AS NEEDED FOR MIGRAINE. MAY REPEAT IN 2 HOURS IF NEEDED. MAX 1TAB TWICE DAILY 10 tablet 11   tirzepatide (MOUNJARO) 2.5 MG/0.5ML Pen Inject 2.5 mg into the skin once a week.     Upadacitinib ER (RINVOQ) 15 MG TB24 Take by mouth.     BOTOX 100 units SOLR injection PROVIDER TO INJECT 155 UNITS INTO THE MUSCLES OF THE HEAD AND NECK EVERY 3 MONTHS. DISCARD REMAINDER. (Patient not taking: Reported on 06/22/2022) 2 each 1   Fremanezumab-vfrm (AJOVY) 225 MG/1.5ML SOAJ Inject 225 mg into the skin every 30 (thirty) days. (Patient not taking: Reported on 06/22/2022) 1.5 mL 3   predniSONE (DELTASONE) 5 MG tablet Take 7.5 mg by mouth daily.     TRULICITY 1.5 MG/0.5ML SOPN Inject 0.75 mg as directed once a week.     No facility-administered medications prior to visit.    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    Diabetes mellitus without complication (HCC)    Goiter    Headache    Hypothyroidism     PAST SURGICAL HISTORY:  Past Surgical History:  Procedure Laterality Date   APPENDECTOMY     CESAREAN SECTION     left knee arthoroscpy twice      WISDOM TOOTH EXTRACTION      FAMILY HISTORY: Family History  Problem Relation Age of Onset   Migraines Neg Hx     SOCIAL HISTORY: Social History   Socioeconomic History   Marital status: Married    Spouse name: Not on file   Number of children: Not on file   Years of education: Not on file   Highest education level: Not on file  Occupational History   Not on file  Tobacco Use   Smoking status: Never   Smokeless tobacco: Never  Substance and Sexual Activity   Alcohol use: Yes    Alcohol/week: 1.0 standard drink of alcohol    Types: 1 Shots of liquor per week    Comment: occasionally   Drug  use: No   Sexual activity: Not on file  Other Topics Concern   Not on file  Social History Narrative   Not on file   Social Determinants of Health   Financial Resource Strain: Not on file  Food Insecurity: Not on file  Transportation Needs: Not on file  Physical Activity: Not on file  Stress: Not on file  Social Connections: Not on file  Intimate Partner Violence: Not on file      PHYSICAL EXAM  Vitals:   06/22/22 0852  BP: 111/76  Pulse: 60  Weight: 206 lb 3.2 oz (93.5 kg)  Height: 5\' 8"  (1.727 m)   Body mass index is 31.35 kg/m.  Generalized: Well developed, in no acute distress   Neurological examination  Mentation: Alert oriented to time, place, history taking. Follows all commands speech and language fluent Cranial nerve II-XII: Pupils were equal round reactive to light. Extraocular movements were full, visual field were full on confrontational test. Facial sensation and strength were normal. Uvula tongue midline. Head turning and shoulder shrug  were normal and symmetric. Motor: The motor testing reveals 5 over 5 strength of all 4 extremities. Good symmetric motor tone is noted throughout.  Sensory: Sensory testing is intact to soft touch on all 4 extremities. No evidence of extinction is noted.  Coordination: Cerebellar testing reveals good finger-nose-finger and heel-to-shin bilaterally.  Gait and station: Gait is normal.    DIAGNOSTIC DATA (LABS, IMAGING, TESTING) - I reviewed patient records, labs, notes, testing and imaging myself where available.      ASSESSMENT AND PLAN 52 y.o. 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 here with:  Migraines  - Start Qulipta 30 mg daily. Didn't want to try another injectable.  - Use Maxalt or Nurtec for abortive therapy  - FU in 6 months or sooner if  needed    Butch Penny, MSN, NP-C 06/22/2022, 8:56 AM [facility] [address] [place], [place] [address] [phone]

## 2022-06-22 NOTE — Patient Instructions (Signed)
Your Plan:  Start Qulipta 30 mg daily  Continue maxalt or nurtec for abortive therapy If your symptoms worsen or you develop new symptoms please let us know.    Thank you for coming to see [place] at [facility]. I hope we have been able to provide you high quality care today.  You may receive a patient satisfaction survey over the next few weeks. We would appreciate your feedback and comments so that we may continue to improve ourselves and the health of our patients.

## 2022-09-09 ENCOUNTER — Other Ambulatory Visit: Payer: Self-pay | Admitting: Adult Health

## 2023-01-11 ENCOUNTER — Ambulatory Visit: Payer: BC Managed Care – PPO | Admitting: Adult Health

## 2023-01-11 ENCOUNTER — Encounter: Payer: Self-pay | Admitting: Adult Health

## 2023-01-11 VITALS — BP 117/69 | HR 61 | Ht 68.0 in | Wt 204.0 lb

## 2023-01-11 DIAGNOSIS — G43719 Chronic migraine without aura, intractable, without status migrainosus: Secondary | ICD-10-CM | POA: Diagnosis not present

## 2023-01-11 NOTE — Progress Notes (Signed)
PATIENT: Ann Bailey DOB: 1970/04/19  REASON FOR VISIT: follow up HISTORY FROM: patient PRIMARY NEUROLOGIST: Dr. Lucia Gaskins   Chief Complaint  Patient presents with   Follow-up    Pt in 19  Pt here for Migraine f/u Pt states sharp pains in back of head      HISTORY OF PRESENT ILLNESS: Today 01/11/23:  Ann Bailey is a 52 y.o. female with a history of Migraines. Returns today for follow-up.  She reports that she has not started the Goff yet.  She states that she has been working with endocrinologist to get her A1c down.  She was started on new medication Rybelsus.  States that she is only been having 3-4 migraines a month.  Typically weather and stressor or trigger.  She states that sure her shoulders have been tense and she has been getting sharp shooting pains in the occipital region bilaterally.  This happens maybe once or twice a week lasting about 30 minutes.  When she does get a migraine she typically can take Maxalt and lay in a dark room and it will eventually resolve.  She returns today for an evaluation.   06/22/22: Ann Bailey is a 52 y.o. female who has been followed in this office for Migraines. Returns today for follow-up.  Last couple of weeks migraines have increased to 2-3 migraines a week. Weather is a trigger. No longer taking Ajovy because botox is working well but currently unable to get Botox due to cost. Able to take Maxalt with good benefit but will also use Nurtec.    REVIEW OF SYSTEMS: Out of a complete 14 system review of symptoms, the patient complains only of the following symptoms, and all other reviewed systems are negative.  ALLERGIES: Allergies  Allergen Reactions   Latex Rash    HOME MEDICATIONS: Outpatient Medications Prior to Visit  Medication Sig Dispense Refill   Atogepant (QULIPTA) 30 MG TABS Take 1 tablet (30 mg total) by mouth daily. 30 tablet 11   atorvastatin (LIPITOR) 40 MG tablet Take by mouth.     Empagliflozin  (JARDIANCE PO) Take by mouth.     FLUoxetine (PROZAC) 40 MG capsule TAKE 1 CAPSULE BY MOUTH EVERY DAY 90 capsule 3   HYDROcodone-acetaminophen (NORCO/VICODIN) 5-325 MG tablet Take by mouth.     ibuprofen (ADVIL,MOTRIN) 800 MG tablet prn     levonorgestrel (MIRENA) 20 MCG/24HR IUD by Intrauterine route.     levothyroxine (SYNTHROID) 175 MCG tablet Take 125 mcg by mouth daily before breakfast.     meclizine (ANTIVERT) 12.5 MG tablet Take by mouth 2 (two) times daily as needed.      ondansetron (ZOFRAN) 8 MG tablet Take 1 tablet (8 mg total) by mouth every 8 (eight) hours as needed for nausea or vomiting. 30 tablet 6   pioglitazone (ACTOS) 30 MG tablet Take by mouth.     propranolol (INDERAL) 20 MG tablet TAKE 1 TABLET BY MOUTH TWICE A DAY 180 tablet 0   Rimegepant Sulfate (NURTEC) 75 MG TBDP Take 75 mg by mouth daily as needed. For migraines. Take as close to onset of migraine as possible. One daily maximum. 10 tablet 6   rizatriptan (MAXALT) 10 MG tablet TAKE 1 TABLET BY MOUTH AS NEEDED FOR MIGRAINE. MAY REPEAT IN 2 HOURS IF NEEDED. MAX 1TAB TWICE DAILY 10 tablet 11   Semaglutide (RYBELSUS) 3 MG TABS Take by mouth daily.     tirzepatide [facility]) 2.5 MG/0.5ML Pen Inject 2.5 mg into the skin once  a week.     Upadacitinib ER (RINVOQ) 15 MG TB24 Take by mouth.     BOTOX 100 units SOLR injection PROVIDER TO INJECT 155 UNITS INTO THE MUSCLES OF THE HEAD AND NECK EVERY 3 MONTHS. DISCARD REMAINDER. (Patient not taking: Reported on 01/11/2023) 2 each 1   No facility-administered medications prior to visit.    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    Diabetes mellitus without complication (HCC)    Goiter    Headache    Hypothyroidism     PAST SURGICAL HISTORY: Past Surgical History:  Procedure Laterality Date   APPENDECTOMY     BACK SURGERY     Dec 2023   CESAREAN SECTION     left knee arthoroscpy twice       WISDOM TOOTH EXTRACTION      FAMILY HISTORY: Family History  Problem Relation Age of Onset   Migraines Neg Hx     SOCIAL HISTORY: Social History   Socioeconomic History   Marital status: Married    Spouse name: Not on file   Number of children: Not on file   Years of education: Not on file   Highest education level: Not on file  Occupational History   Not on file  Tobacco Use   Smoking status: Never   Smokeless tobacco: Never  Substance and Sexual Activity   Alcohol use: Yes    Alcohol/week: 1.0 standard drink of alcohol    Types: 1 Shots of liquor per week    Comment: occasionally   Drug use: No   Sexual activity: Not on file  Other Topics Concern   Not on file  Social History Narrative   Pt works [employer]    Pt lives with Husband and 2 sons    Social Determinants of Health   Financial Resource Strain: Low Risk  (12/18/2022)   Received from [facility]   Overall Financial Resource Strain (CARDIA)    Difficulty of Paying Living Expenses: Not hard at all  Food Insecurity: No Food Insecurity (12/18/2022)   [facility] from [facility]   Hunger Vital Sign    Worried About Running Out of Food in the Last Year: Never true    Ran Out of Food in the Last Year: Never true  Transportation Needs: No Transportation Needs (12/18/2022)   Received from [facility]    Lack of Transportation (Medical): No    Lack of Transportation (Non-Medical): No  Physical Activity: Not on file  Stress: Not on file  Social Connections: Unknown (06/18/2021)   Received from [facility]   Social Network    Social Network: Not on file  Intimate Partner Violence: Unknown (05/22/2021)   Received from [facility]   HITS    Physically Hurt: Not on file    Insult or Talk Down To: Not on file    Threaten Physical Harm: Not on file    Scream or Curse: Not on file      PHYSICAL EXAM  Vitals:   01/11/23 0813  BP:  117/69  Pulse: 61  Weight: 204 lb (92.5 kg)  Height: 5\' 8"  (1.727 m)   Body mass index is 31.02 kg/m.  Generalized: Well developed, in no acute distress   Neurological examination  Mentation: Alert oriented to time, place, history taking. Follows all commands speech and language fluent  Cranial nerve II-XII: Pupils were equal round reactive to light. Extraocular movements were full, visual field were full on confrontational test. . Head turning and shoulder shrug  were normal and symmetric. Motor: The motor testing reveals 5 over 5 strength of all 4 extremities. Good symmetric motor tone is noted throughout.  Sensory: Sensory testing is intact to soft touch on all 4 extremities. No evidence of extinction is noted.  Coordination: Cerebellar testing reveals good finger-nose-finger and heel-to-shin bilaterally.  Gait and station: Gait is normal.    DIAGNOSTIC DATA (LABS, IMAGING, TESTING) - I reviewed patient records, labs, notes, testing and imaging myself where available.      ASSESSMENT AND PLAN 52 y.o. 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 here with:  Migraines  - Start Qulipta 30 mg daily.  - Use Maxalt or Nurtec for abortive therapy  -Having new symptoms of sharp shooting pains in the occipital region.  We discussed potentially doing imaging however she prefers to start [place] first if that is not helpful she will let us know.  Possible occipital neuralgia? - FU in 6 months or sooner if needed    Butch Penny, MSN, NP-C 01/11/2023, 8:31 AM [facility] [address], [address] [place], [place] [address] [phone]

## 2023-01-11 NOTE — Patient Instructions (Signed)
Your Plan:  Start Qulita 30 mg at bedtime If your symptoms worsen or you develop new symptoms please let us know.       Thank you for coming to see [place] at [facility]. I hope we have been able to provide you high quality care today.  You may receive a patient satisfaction survey over the next few weeks. We would appreciate your feedback and comments so that we may continue to improve ourselves and the health of our patients.

## 2023-03-30 ENCOUNTER — Other Ambulatory Visit: Payer: Self-pay | Admitting: Adult Health

## 2023-06-15 ENCOUNTER — Other Ambulatory Visit: Payer: Self-pay | Admitting: Adult Health

## 2023-06-15 NOTE — Telephone Encounter (Signed)
 Dr. Narciso Backers started this medication- Prozac .  That is why I refilled it however I do think this is probably something that can be refilled in the future by her PCP.  I am okay with refilling this month if the patient thinks her primary care can take over this.

## 2023-06-16 NOTE — Telephone Encounter (Signed)
 Spoke with patient. She said she had not seen her PCP in awhile because she sees so many specialists but she will give them a call. She appreciates a temporary refill right now however. I sent a refill to her pharmacy x 1 month.

## 2023-08-26 ENCOUNTER — Ambulatory Visit: Payer: BC Managed Care – PPO | Admitting: Adult Health

## 2024-01-17 ENCOUNTER — Ambulatory Visit: Payer: Self-pay | Admitting: Neurology

## 2024-01-18 ENCOUNTER — Ambulatory Visit: Payer: Self-pay | Admitting: Neurology
